# Patient Record
Sex: Female | Born: 1965 | Hispanic: Yes | Marital: Married | Smoking: Never smoker
Health system: Southern US, Community
[De-identification: ages and names within clinical notes are randomized; demographics above are authoritative.]

## PROBLEM LIST (undated history)

## (undated) DIAGNOSIS — I10 Essential (primary) hypertension: Secondary | ICD-10-CM

## (undated) DIAGNOSIS — E119 Type 2 diabetes mellitus without complications: Secondary | ICD-10-CM

## (undated) HISTORY — PX: TUBAL LIGATION: SHX77

## (undated) HISTORY — DX: Essential (primary) hypertension: I10

## (undated) HISTORY — DX: Type 2 diabetes mellitus without complications: E11.9

## (undated) HISTORY — PX: CHOLECYSTECTOMY: SHX55

---

## 2002-02-17 ENCOUNTER — Encounter: Payer: Self-pay | Admitting: General Surgery

## 2002-02-17 ENCOUNTER — Ambulatory Visit (HOSPITAL_COMMUNITY): Admission: RE | Admit: 2002-02-17 | Discharge: 2002-02-17 | Payer: Self-pay | Admitting: General Surgery

## 2012-11-10 ENCOUNTER — Other Ambulatory Visit (HOSPITAL_COMMUNITY): Payer: Self-pay | Admitting: Physician Assistant

## 2012-11-10 DIAGNOSIS — Z139 Encounter for screening, unspecified: Secondary | ICD-10-CM

## 2012-11-23 ENCOUNTER — Ambulatory Visit (HOSPITAL_COMMUNITY)
Admission: RE | Admit: 2012-11-23 | Discharge: 2012-11-23 | Disposition: A | Discharge status: Home / Self Care | Payer: Self-pay | Source: Ambulatory Visit | Attending: Physician Assistant | Admitting: Physician Assistant

## 2012-11-23 DIAGNOSIS — Z139 Encounter for screening, unspecified: Secondary | ICD-10-CM

## 2012-11-26 ENCOUNTER — Other Ambulatory Visit: Payer: Self-pay | Admitting: Physician Assistant

## 2012-11-26 DIAGNOSIS — R928 Other abnormal and inconclusive findings on diagnostic imaging of breast: Secondary | ICD-10-CM

## 2013-01-13 ENCOUNTER — Other Ambulatory Visit (HOSPITAL_COMMUNITY): Payer: Self-pay | Admitting: Nurse Practitioner

## 2013-01-13 ENCOUNTER — Ambulatory Visit (HOSPITAL_COMMUNITY)
Admission: RE | Admit: 2013-01-13 | Discharge: 2013-01-13 | Disposition: A | Discharge status: Home / Self Care | Payer: PRIVATE HEALTH INSURANCE | Source: Ambulatory Visit | Attending: Nurse Practitioner | Admitting: Nurse Practitioner

## 2013-01-13 ENCOUNTER — Ambulatory Visit (HOSPITAL_COMMUNITY)
Admission: RE | Admit: 2013-01-13 | Discharge: 2013-01-13 | Disposition: A | Discharge status: Home / Self Care | Payer: PRIVATE HEALTH INSURANCE | Source: Ambulatory Visit | Attending: Physician Assistant | Admitting: Physician Assistant

## 2013-01-13 ENCOUNTER — Other Ambulatory Visit (HOSPITAL_COMMUNITY): Payer: Self-pay | Admitting: Physician Assistant

## 2013-01-13 DIAGNOSIS — R928 Other abnormal and inconclusive findings on diagnostic imaging of breast: Secondary | ICD-10-CM

## 2013-11-17 ENCOUNTER — Other Ambulatory Visit (HOSPITAL_COMMUNITY): Payer: Self-pay | Admitting: Physician Assistant

## 2013-11-17 DIAGNOSIS — Z1231 Encounter for screening mammogram for malignant neoplasm of breast: Secondary | ICD-10-CM

## 2013-12-06 ENCOUNTER — Ambulatory Visit (HOSPITAL_COMMUNITY): Payer: Self-pay

## 2013-12-13 ENCOUNTER — Ambulatory Visit (HOSPITAL_COMMUNITY)
Admission: RE | Admit: 2013-12-13 | Discharge: 2013-12-13 | Disposition: A | Discharge status: Home / Self Care | Payer: Self-pay | Source: Ambulatory Visit | Attending: Physician Assistant | Admitting: Physician Assistant

## 2013-12-13 DIAGNOSIS — Z1231 Encounter for screening mammogram for malignant neoplasm of breast: Secondary | ICD-10-CM

## 2014-12-09 ENCOUNTER — Other Ambulatory Visit: Payer: Self-pay | Admitting: Physician Assistant

## 2014-12-09 LAB — COMPREHENSIVE METABOLIC PANEL
ALT: 47 U/L — ABNORMAL HIGH (ref 6–29)
AST: 41 U/L — AB (ref 10–35)
Albumin: 3.9 g/dL (ref 3.6–5.1)
Alkaline Phosphatase: 116 U/L — ABNORMAL HIGH (ref 33–115)
BILIRUBIN TOTAL: 0.5 mg/dL (ref 0.2–1.2)
BUN: 7 mg/dL (ref 7–25)
CALCIUM: 9.2 mg/dL (ref 8.6–10.2)
CHLORIDE: 100 mmol/L (ref 98–110)
CO2: 27 mmol/L (ref 20–31)
Creat: 0.45 mg/dL — ABNORMAL LOW (ref 0.50–1.10)
Glucose, Bld: 157 mg/dL — ABNORMAL HIGH (ref 65–99)
POTASSIUM: 4.5 mmol/L (ref 3.5–5.3)
Sodium: 136 mmol/L (ref 135–146)
Total Protein: 7.9 g/dL (ref 6.1–8.1)

## 2014-12-09 LAB — HEMOGLOBIN A1C
HEMOGLOBIN A1C: 10.7 % — AB (ref <5.7)
Mean Plasma Glucose: 260 mg/dL — ABNORMAL HIGH (ref <117)

## 2014-12-09 LAB — LIPID PANEL
CHOL/HDL RATIO: 4.1 ratio (ref <=5.0)
Cholesterol: 163 mg/dL (ref 125–200)
HDL: 40 mg/dL — AB (ref >=46)
LDL Cholesterol: 98 mg/dL (ref <130)
Triglycerides: 125 mg/dL (ref <150)
VLDL: 25 mg/dL (ref <30)

## 2014-12-09 LAB — HEMOGLOBIN: Hemoglobin: 11.7 g/dL — ABNORMAL LOW (ref 12.0–15.0)

## 2014-12-10 LAB — MICROALBUMIN, URINE: Microalb, Ur: 3.3 mg/dL

## 2014-12-12 ENCOUNTER — Ambulatory Visit: Payer: Self-pay | Admitting: Physician Assistant

## 2015-01-10 ENCOUNTER — Other Ambulatory Visit: Payer: Self-pay | Admitting: Physician Assistant

## 2015-01-16 ENCOUNTER — Encounter: Payer: Self-pay | Admitting: Physician Assistant

## 2015-01-16 ENCOUNTER — Other Ambulatory Visit: Payer: Self-pay | Admitting: Physician Assistant

## 2015-01-16 ENCOUNTER — Ambulatory Visit: Payer: Self-pay | Admitting: Physician Assistant

## 2015-01-16 VITALS — BP 128/78 | HR 77 | Temp 98.1°F | Ht 58.5 in | Wt 153.8 lb

## 2015-01-16 DIAGNOSIS — E785 Hyperlipidemia, unspecified: Secondary | ICD-10-CM | POA: Insufficient documentation

## 2015-01-16 DIAGNOSIS — E118 Type 2 diabetes mellitus with unspecified complications: Secondary | ICD-10-CM

## 2015-01-16 DIAGNOSIS — E1165 Type 2 diabetes mellitus with hyperglycemia: Secondary | ICD-10-CM

## 2015-01-16 DIAGNOSIS — D649 Anemia, unspecified: Secondary | ICD-10-CM

## 2015-01-16 DIAGNOSIS — E669 Obesity, unspecified: Secondary | ICD-10-CM | POA: Insufficient documentation

## 2015-01-16 DIAGNOSIS — Z1239 Encounter for other screening for malignant neoplasm of breast: Secondary | ICD-10-CM

## 2015-01-16 DIAGNOSIS — IMO0002 Reserved for concepts with insufficient information to code with codable children: Secondary | ICD-10-CM | POA: Insufficient documentation

## 2015-01-16 LAB — GLUCOSE, POCT (MANUAL RESULT ENTRY): POC Glucose: 235 mg/dl — AB (ref 70–99)

## 2015-01-16 MED ORDER — LISINOPRIL 10 MG PO TABS: 10.0000 mg | ORAL_TABLET | Freq: Every day | ORAL | Status: DC

## 2015-01-16 NOTE — Progress Notes (Signed)
BP 128/78 mmHg  Pulse 77  Temp(Src) 98.1 F (36.7 C)  Ht 4' 10.5" (1.486 m)  Wt 153 lb 12.8 oz (69.763 kg)  BMI 31.59 kg/m2  SpO2 99%   Subjective:    Patient ID: Alice McgregorAlma Perez, female    DOB: 01/05/1966, 49 y.o.   MRN: [id number]  HPI: Alice Perez is a 49 y.o. female presenting on 01/16/2015 for Diabetes   HPI Chief Complaint  Patient presents with  . Diabetes    pt states she feels good. pt got labs drawn about a month ago. pt brought BS log.   Pt is also supposed to be taking lisinopril but she is not taking it and doesn't remember the medication (she was taking it at her last OV in September)  Relevant past medical, surgical, family and social history reviewed and updated as indicated. Interim medical history since our last visit reviewed. Allergies and medications reviewed and updated.    Current outpatient prescriptions:  .  ferrous sulfate 325 (65 FE) MG tablet, Take 325 mg by mouth daily., Disp: , Rfl:  .  glyBURIDE micronized (GLYNASE) 6 MG tablet, Take 6 mg by mouth 2 (two) times daily., Disp: , Rfl:  .  insulin NPH-regular Human (NOVOLIN 70/30) (70-30) 100 UNIT/ML injection, Inject into the skin 2 (two) times daily. 15 units qAM and 14 units qhs, Disp: , Rfl:  .  metFORMIN (GLUCOPHAGE) 1000 MG tablet, Take 1 tablet (1,000 mg total) by mouth 2 (two) times daily with a meal. Tome una tableta por boca dos veces diarias con comida, Disp: 60 tablet, Rfl: 2 .  Omega-3 Fatty Acids (FISH OIL PO), Take by mouth daily., Disp: , Rfl:    Review of Systems  Constitutional: Positive for appetite change. Negative for fever, chills, diaphoresis, fatigue and unexpected weight change.  HENT: Negative for congestion, dental problem, drooling, ear pain, facial swelling, hearing loss, mouth sores, sneezing, sore throat, trouble swallowing and voice change.   Eyes: Negative for pain, discharge, redness, itching and visual disturbance.  Respiratory: Negative for cough, choking,  shortness of breath and wheezing.   Cardiovascular: Negative for chest pain, palpitations and leg swelling.  Gastrointestinal: Negative for vomiting, abdominal pain, diarrhea, constipation and blood in stool.  Endocrine: Negative for cold intolerance, heat intolerance and polydipsia.  Genitourinary: Negative for dysuria, hematuria and decreased urine volume.  Musculoskeletal: Negative for back pain, arthralgias and gait problem.  Skin: Negative for rash.  Allergic/Immunologic: Negative for environmental allergies.  Neurological: Negative for seizures, syncope, light-headedness and headaches.  Hematological: Negative for adenopathy.  Psychiatric/Behavioral: Negative for suicidal ideas, dysphoric mood and agitation. The patient is not nervous/anxious.     Per HPI unless specifically indicated above     Objective:    BP 128/78 mmHg  Pulse 77  Temp(Src) 98.1 F (36.7 C)  Ht 4' 10.5" (1.486 m)  Wt 153 lb 12.8 oz (69.763 kg)  BMI 31.59 kg/m2  SpO2 99%  Wt Readings from Last 3 Encounters:  01/16/15 153 lb 12.8 oz (69.763 kg)    Physical Exam  Constitutional: She is oriented to person, place, and time. She appears well-developed and well-nourished.  HENT:  Head: Normocephalic and atraumatic.  Neck: Neck supple.  Cardiovascular: Normal rate and regular rhythm.   Pulmonary/Chest: Effort normal and breath sounds normal.  Abdominal: Soft. Bowel sounds are normal. She exhibits no mass. There is no tenderness.  Musculoskeletal: She exhibits no edema.  Lymphadenopathy:    She has no cervical adenopathy.  Neurological:  She is alert and oriented to person, place, and time.  Skin: Skin is warm and dry.  Psychiatric: She has a normal mood and affect. Her behavior is normal.  Vitals reviewed. foot exam done  Results for orders placed or performed in visit on 12/09/14  Hemoglobin  Result Value Ref Range   Hemoglobin 11.7 (L) 12.0 - 15.0 g/dL  Comprehensive metabolic panel  Result Value  Ref Range   Sodium 136 135 - 146 mmol/L   Potassium 4.5 3.5 - 5.3 mmol/L   Chloride 100 98 - 110 mmol/L   CO2 27 20 - 31 mmol/L   Glucose, Bld 157 (H) 65 - 99 mg/dL   BUN 7 7 - 25 mg/dL   Creat 1.61 (L) 0.96 - 1.10 mg/dL   Total Bilirubin 0.5 0.2 - 1.2 mg/dL   Alkaline Phosphatase 116 (H) 33 - 115 U/L   AST 41 (H) 10 - 35 U/L   ALT 47 (H) 6 - 29 U/L   Total Protein 7.9 6.1 - 8.1 g/dL   Albumin 3.9 3.6 - 5.1 g/dL   Calcium 9.2 8.6 - 04.5 mg/dL  Lipid panel  Result Value Ref Range   Cholesterol 163 125 - 200 mg/dL   Triglycerides 409 <811 mg/dL   HDL 40 (L) >=91 mg/dL   Total CHOL/HDL Ratio 4.1 <=5.0 Ratio   VLDL 25 <30 mg/dL   LDL Cholesterol 98 <478 mg/dL  Hemoglobin G9F  Result Value Ref Range   Hgb A1c MFr Bld 10.7 (H) <5.7 %   Mean Plasma Glucose 260 (H) <117 mg/dL  Microalbumin, urine  Result Value Ref Range   Microalb, Ur 3.3 Not estab mg/dL      Assessment & Plan:   Encounter Diagnoses  Name Primary?  . Type 2 diabetes mellitus with complication, unspecified long term insulin use status (HCC) Yes  . Uncontrolled type 2 diabetes mellitus with complication, unspecified long term insulin use status (HCC)   . Obesity, unspecified   . Anemia, unspecified anemia type   . Hyperlipemia   . Screening for breast cancer    -Order mammo -Discussed a1c not consistent with bs log -pt will RTO for Nurse visit later this week to check her meter (fbs 235 this am in office) -pt needs to restart her lisinopril -F/u OV 3 mo. rto sooner prn

## 2015-01-18 ENCOUNTER — Ambulatory Visit: Payer: Self-pay | Admitting: Physician Assistant

## 2015-01-18 DIAGNOSIS — E118 Type 2 diabetes mellitus with unspecified complications: Principal | ICD-10-CM

## 2015-01-18 DIAGNOSIS — E1165 Type 2 diabetes mellitus with hyperglycemia: Secondary | ICD-10-CM

## 2015-01-18 NOTE — Progress Notes (Signed)
-  had pt check her BS this morning with her own BS monitor to make sure it works properly, pt showed she knew how to properly do her own BS checks. -patient's BS was 272, pt is fasting -pt commented that she checked her sugar last night and it was in the 300s -discussed with pt the importance of writing down her true BS values in order to help in controlling her diabetes. Pt understood. -I explained to pt what her a1c level was as well as what we expect her BS levels to be with her a1c being that high. -I explained to pt what an a1c was since she stated she was not sure. Pt stated she understood -discussed with pt Shannon's plan of increasing her insulin to 20 units BID and to let us know if her BS levels get less than 100. Pt understood. -i explained to pt the importance of  -a printed out rx for novolin 70/30 was given to pt for her to pick up

## 2015-01-24 ENCOUNTER — Other Ambulatory Visit: Payer: Self-pay | Admitting: Physician Assistant

## 2015-01-24 DIAGNOSIS — Z1231 Encounter for screening mammogram for malignant neoplasm of breast: Secondary | ICD-10-CM

## 2015-02-01 ENCOUNTER — Ambulatory Visit (HOSPITAL_COMMUNITY)
Admission: RE | Admit: 2015-02-01 | Discharge: 2015-02-01 | Disposition: A | Discharge status: Home / Self Care | Payer: Self-pay | Source: Ambulatory Visit | Attending: Physician Assistant | Admitting: Physician Assistant

## 2015-02-01 DIAGNOSIS — Z1231 Encounter for screening mammogram for malignant neoplasm of breast: Secondary | ICD-10-CM

## 2015-03-20 ENCOUNTER — Other Ambulatory Visit: Payer: Self-pay | Admitting: Physician Assistant

## 2015-04-17 ENCOUNTER — Ambulatory Visit: Payer: Self-pay | Admitting: Physician Assistant

## 2015-04-24 ENCOUNTER — Encounter: Payer: Self-pay | Admitting: Physician Assistant

## 2015-04-24 ENCOUNTER — Ambulatory Visit: Payer: Self-pay | Admitting: Physician Assistant

## 2015-04-24 VITALS — BP 124/78 | HR 76 | Temp 97.7°F | Ht 58.5 in | Wt 154.2 lb

## 2015-04-24 DIAGNOSIS — D649 Anemia, unspecified: Secondary | ICD-10-CM

## 2015-04-24 DIAGNOSIS — E1165 Type 2 diabetes mellitus with hyperglycemia: Secondary | ICD-10-CM

## 2015-04-24 DIAGNOSIS — E118 Type 2 diabetes mellitus with unspecified complications: Principal | ICD-10-CM

## 2015-04-24 LAB — HEMOGLOBIN A1C
Hgb A1c MFr Bld: 11 % — ABNORMAL HIGH (ref <5.7)
MEAN PLASMA GLUCOSE: 269 mg/dL — AB (ref <117)

## 2015-04-24 LAB — HEMOGLOBIN: Hemoglobin: 10.8 g/dL — ABNORMAL LOW (ref 12.0–15.0)

## 2015-04-24 NOTE — Progress Notes (Signed)
BP 124/78 mmHg  Pulse 76  Temp(Src) 97.7 F (36.5 C)  Ht 4' 10.5" (1.486 m)  Wt 154 lb 4 oz (69.967 kg)  BMI 31.69 kg/m2  SpO2 99%   Subjective:    Patient ID: Alice McgregorAlma Perez, female    DOB: 28-Feb-1965, 50 y.o.   MRN: [id number]  HPI: Alice Mcgregorlma Perez is a [age] y.o. female presenting on 04/24/2015 for Diabetes   HPI   Pt did not bring her bs log with her.  She says it is good.  This am was 198 before breakfast.  Relevant past medical, surgical, family and social history reviewed and updated as indicated. Interim medical history since our last visit reviewed. Allergies and medications reviewed and updated.  Current outpatient prescriptions:  .  ferrous sulfate 325 (65 FE) MG tablet, Take 325 mg by mouth daily., Disp: , Rfl:  .  glyBURIDE micronized (GLYNASE) 6 MG tablet, 1 po bid for diabetes.  Tome una tableta por boca dos veces diarias con comida, Disp: 60 tablet, Rfl: 2 .  insulin NPH-regular Human (NOVOLIN 70/30 RELION) (70-30) 100 UNIT/ML injection, Inject 15 units subcutaneously every morning with breakfast and 14 units every evening with supper.   inyecte 15 unidades subcutaneo cada maana con el desayuno y 14 unidades cada tarde con la sena (Patient taking differently: Inject 20 Units into the skin 2 (two) times daily with a meal. Inject 15 units subcutaneously every morning with breakfast and 14 units every evening with supper.   inyecte 15 unidades subcutaneo cada maana con el desayuno y 14 unidades cada tarde con la sena), Disp: 1 vial, Rfl: 3 .  lisinopril (PRINIVIL,ZESTRIL) 10 MG tablet, Take 1 tablet (10 mg total) by mouth daily. Tome una tableta por boca diaria, Disp: 30 tablet, Rfl: 5 .  metFORMIN (GLUCOPHAGE) 1000 MG tablet, Take 1 tablet (1,000 mg total) by mouth 2 (two) times daily with a meal. Tome una tableta por boca dos veces diarias con comida, Disp: 60 tablet, Rfl: 2 .  Omega-3 Fatty Acids (FISH OIL PO), Take 1 capsule by mouth daily. , Disp: , Rfl:    Review of  Systems  Constitutional: Negative for fever, chills, diaphoresis, appetite change, fatigue and unexpected weight change.  HENT: Negative for congestion, dental problem, drooling, ear pain, facial swelling, hearing loss, mouth sores, sneezing, sore throat, trouble swallowing and voice change.   Eyes: Negative for pain, discharge, redness, itching and visual disturbance.  Respiratory: Negative for cough, choking, shortness of breath and wheezing.   Cardiovascular: Negative for chest pain, palpitations and leg swelling.  Gastrointestinal: Negative for vomiting, abdominal pain, diarrhea, constipation and blood in stool.  Endocrine: Negative for cold intolerance, heat intolerance and polydipsia.  Genitourinary: Negative for dysuria, hematuria and decreased urine volume.  Musculoskeletal: Negative for back pain, arthralgias and gait problem.  Skin: Negative for rash.  Allergic/Immunologic: Negative for environmental allergies.  Neurological: Negative for seizures, syncope, light-headedness and headaches.  Hematological: Negative for adenopathy.  Psychiatric/Behavioral: Negative for suicidal ideas, dysphoric mood and agitation. The patient is not nervous/anxious.     Per HPI unless specifically indicated above     Objective:    BP 124/78 mmHg  Pulse 76  Temp(Src) 97.7 F (36.5 C)  Ht 4' 10.5" (1.486 m)  Wt 154 lb 4 oz (69.967 kg)  BMI 31.69 kg/m2  SpO2 99%  Wt Readings from Last 3 Encounters:  04/24/15 154 lb 4 oz (69.967 kg)  01/16/15 153 lb 12.8 oz (69.763 kg)  Physical Exam  Constitutional: She is oriented to person, place, and time. She appears well-developed and well-nourished.  Pulmonary/Chest: Effort normal.  Neurological: She is alert and oriented to person, place, and time.  Skin: Skin is warm and dry.  Psychiatric: She has a normal mood and affect. Her behavior is normal.  Vitals reviewed.       Assessment & Plan:   Encounter Diagnoses  Name Primary?  Marland Kitchen  Uncontrolled type 2 diabetes mellitus with complication, unspecified long term insulin use status (HCC) Yes  . Anemia, unspecified anemia type      Get labs drawn F/u 2 wk WITH BS LOG.  Discussed with pt that cannot safely adjust her insulin w/o the bs log. She states understanding

## 2015-05-10 ENCOUNTER — Ambulatory Visit: Payer: Self-pay | Admitting: Physician Assistant

## 2015-05-10 ENCOUNTER — Encounter: Payer: Self-pay | Admitting: Physician Assistant

## 2015-05-10 VITALS — BP 116/72 | HR 80 | Temp 97.7°F | Ht 58.5 in | Wt 152.7 lb

## 2015-05-10 DIAGNOSIS — E1165 Type 2 diabetes mellitus with hyperglycemia: Secondary | ICD-10-CM

## 2015-05-10 DIAGNOSIS — D649 Anemia, unspecified: Secondary | ICD-10-CM

## 2015-05-10 DIAGNOSIS — E118 Type 2 diabetes mellitus with unspecified complications: Principal | ICD-10-CM

## 2015-05-10 MED ORDER — METFORMIN HCL 1000 MG PO TABS: 1000.0000 mg | ORAL_TABLET | Freq: Two times a day (BID) | ORAL | Status: DC

## 2015-05-10 NOTE — Progress Notes (Signed)
BP 116/72 mmHg  Pulse 80  Temp(Src) 97.7 F (36.5 C)  Ht 4' 10.5" (1.486 m)  Wt 152 lb 11.2 oz (69.264 kg)  BMI 31.37 kg/m2  SpO2 98%   Subjective:    Patient ID: Alice McgregorAlma Perez, female    DOB: January 21, 1966, 50 y.o.   MRN: [id number]  HPI: Alice Perez is a 50 y.o. female presenting on 05/10/2015 for Diabetes   HPI  Pt brings bs log- numbers all in the 100's.  Yesterday was 102.  highest was 179.  Discussed this inconsistent with a1c of 11.   bs today 179.- fasting (checked in office)  Relevant past medical, surgical, family and social history reviewed and updated as indicated. Interim medical history since our last visit reviewed. Allergies and medications reviewed and updated.  Current outpatient prescriptions:  .  ferrous sulfate 325 (65 FE) MG tablet, Take 325 mg by mouth daily., Disp: , Rfl:  .  glyBURIDE micronized (GLYNASE) 6 MG tablet, 1 po bid for diabetes.  Tome una tableta por boca dos veces diarias con comida, Disp: 60 tablet, Rfl: 2 .  insulin NPH-regular Human (NOVOLIN 70/30 RELION) (70-30) 100 UNIT/ML injection, Inject 15 units subcutaneously every morning with breakfast and 14 units every evening with supper.   inyecte 15 unidades subcutaneo cada maana con el desayuno y 14 unidades cada tarde con la sena (Patient taking differently: Inject 20 Units into the skin 2 (two) times daily with a meal. Inject 15 units subcutaneously every morning with breakfast and 14 units every evening with supper.   inyecte 15 unidades subcutaneo cada maana con el desayuno y 14 unidades cada tarde con la sena), Disp: 1 vial, Rfl: 3 .  lisinopril (PRINIVIL,ZESTRIL) 10 MG tablet, Take 1 tablet (10 mg total) by mouth daily. Tome una tableta por boca diaria, Disp: 30 tablet, Rfl: 5 .  Omega-3 Fatty Acids (FISH OIL PO), Take 1 capsule by mouth daily. , Disp: , Rfl:  .  metFORMIN (GLUCOPHAGE) 1000 MG tablet, Take 1 tablet (1,000 mg total) by mouth 2 (two) times daily with a meal. Tome una tableta  por boca dos veces diarias con comida (Patient not taking: Reported on 05/10/2015), Disp: 60 tablet, Rfl: 2   Review of Systems  Constitutional: Negative for fever, chills, diaphoresis, appetite change, fatigue and unexpected weight change.  HENT: Negative for congestion, dental problem, drooling, ear pain, facial swelling, hearing loss, mouth sores, sneezing, sore throat, trouble swallowing and voice change.   Eyes: Negative for pain, discharge, redness, itching and visual disturbance.  Respiratory: Negative for cough, choking, shortness of breath and wheezing.   Cardiovascular: Negative for chest pain, palpitations and leg swelling.  Gastrointestinal: Negative for vomiting, abdominal pain, diarrhea, constipation and blood in stool.  Endocrine: Negative for cold intolerance, heat intolerance and polydipsia.  Genitourinary: Negative for dysuria, hematuria and decreased urine volume.  Musculoskeletal: Negative for back pain, arthralgias and gait problem.  Skin: Negative for rash.  Allergic/Immunologic: Negative for environmental allergies.  Neurological: Negative for seizures, syncope, light-headedness and headaches.  Hematological: Negative for adenopathy.  Psychiatric/Behavioral: Negative for suicidal ideas, dysphoric mood and agitation. The patient is not nervous/anxious.     Per HPI unless specifically indicated above     Objective:    BP 116/72 mmHg  Pulse 80  Temp(Src) 97.7 F (36.5 C)  Ht 4' 10.5" (1.486 m)  Wt 152 lb 11.2 oz (69.264 kg)  BMI 31.37 kg/m2  SpO2 98%  Wt Readings from Last 3 Encounters:  05/10/15  152 lb 11.2 oz (69.264 kg)  04/24/15 154 lb 4 oz (69.967 kg)  01/16/15 153 lb 12.8 oz (69.763 kg)    Physical Exam  Constitutional: She is oriented to person, place, and time. She appears well-developed and well-nourished.  HENT:  Head: Normocephalic and atraumatic.  Neck: Neck supple.  Cardiovascular: Normal rate and regular rhythm.   Pulmonary/Chest: Effort  normal and breath sounds normal.  Abdominal: Soft. Bowel sounds are normal. She exhibits no mass. There is no hepatosplenomegaly. There is no tenderness.  Musculoskeletal: She exhibits no edema.  Lymphadenopathy:    She has no cervical adenopathy.  Neurological: She is alert and oriented to person, place, and time.  Skin: Skin is warm and dry.  Psychiatric: She has a normal mood and affect. Her behavior is normal.  Vitals reviewed.   Results for orders placed or performed in visit on 04/24/15  HgB A1c  Result Value Ref Range   Hgb A1c MFr Bld 11.0 (H) <5.7 %   Mean Plasma Glucose 269 (H) <117 mg/dL  Hemoglobin  Result Value Ref Range   Hemoglobin 10.8 (L) 12.0 - 15.0 g/dL      Assessment & Plan:    Encounter Diagnoses  Name Primary?  Marland Kitchen Uncontrolled type 2 diabetes mellitus with complication, unspecified long term insulin use status (HCC) Yes  . Anemia, unspecified anemia type     -Reviewed labs with pt -apptointment with nurse 1 wk to check meter -F/u 1 month with bs log -Continue iron and eat iron rich diet

## 2015-05-10 NOTE — Patient Instructions (Addendum)
Dieta con alto contenido de hierro (Iron-Rich Diet) El hierro es un mineral que ayuda al organismo a producir hemoglobina. La hemoglobina es una protena de los glbulos rojos que transporta el oxgeno a los tejidos del cuerpo. Consumir muy poco hierro Stryker Corporationpuede hacer que se sienta dbil y Hawthornecansado, y aumentar su riesgo de contraer infecciones. Consumir la cantidad suficiente de hierro es necesario para el metabolismo del cuerpo, el funcionamiento muscular y el [place] Cypresssistema nervioso. El hierro es un componente natural de muchos alimentos. Tambin puede agregarse a los alimentos, o bien los alimentos pueden fortificarse con hierro. Hay dos tipos de hierro de los alimentos:  Hierro hemo. El organismo absorbe el hierro hemo con mayor facilidad que el no hemo. La carne de res, de ave y de pescado contienen hierro hemo.  Hierro no hemo. Se encuentra en los complementos alimenticios, los cereales fortificados con hierro, los frijoles y las verduras. Es posible que deba seguir una dieta con alto contenido de hierro en los siguientes casos:  Si le han diagnosticado una deficiencia de hierro o anemia por deficiencia de hierro.  Si tiene una enfermedad que le impide absorber el hierro de los alimentos, por ejemplo:  Infecciones intestinales.  Enfermedad celaca. Esto incluye la inflamacin permanente (crnica) de los intestinos.  No consume suficiente hierro.  Consume una dieta que incluye muchos alimentos que afectan la absorcin de hierro.  Ha perdido The Progressive Corporationmucha sangre.  Tiene sangrado abundante cuando Sebekamenstra.  Est embarazada. EN QU CONSISTE EL PLAN? El mdico puede ayudarlo a Production assistant, radiodeterminar la cantidad de hierro que necesita a diario en funcin de su cuadro clnico. Por lo general, Adelene Amascuando una persona consume cantidades suficientes de hierro en la dieta, se satisfacen las siguientes necesidades de hierro:  Hombres.  De 14 a 18aos: 11mg  al da.  De 19 a 50aos: 8mg  al Futures traderda.  Mujeres.   De 14 a  18aos: 15mg  al da.  De 19 a 50aos: 18mg  al da.  Mayores de 50aos: 8mg  al da.  Embarazadas: 27mg  al da.  Mujeres en perodo de lactancia: 9mg  al da. QU DEBO SABER ACERCA DE LA DIETA CON ALTO CONTENIDO DE HIERRO?  Consuma frutas y verduras frescas con alto contenido de vitaminaC junto con alimentos ricos en hierro. Esto ayudar a aumentar la cantidad de hierro que el cuerpo absorbe de los alimentos, en especial, con los que contienen hierro no hemo. Entre los alimentos con alto contenido de vitaminaC se incluyen las Refugionaranjas, los pimientos morrones, los tomates y Social workerel mango.  Tome los suplementos de hierro solamente como se lo haya indicado el mdico. La sobredosis de hierro puede ser potencialmente mortal. Si le recetan suplementos de hierro, tmelos con jugo de naranja o un suplemento de vitaminaC.  Cocine los alimentos en ollas de hierro.   Consuma alimentos que contengan hierro no hemo junto con aquellos con alto contenido de hierro hemo. Esto ayuda a mejorar la absorcin de hierro.   Determinados alimentos y ciertas bebidas contienen compuestos que afectan la absorcin de hierro. No consuma estos alimentos en la misma comida que aquellos con alto contenido de hierro o con suplementos de Company secretaryhierro. Estos incluyen los siguientes:  Caf, t negro y vino tinto.  Leche, productos lcteos y alimentos con alto contenido de calcio.  Frijoles, porotos de soja y [place] Elmoguisantes.  Cereales integrales.  Cuando consuma alimentos que contengan hierro no hemo y compuestos que afecten la absorcin de hierro, siga estos consejos para mejorar la absorcin de hierro.   Antes de cocinarlos, remoje  los frijoles durante la noche.  Antes de usarlos, remoje los cereales integrales durante la noche y culelos.  Prepare un fermento con las harinas antes del horneado, como si usara levadura en la masa del pan. QU ALIMENTOS PUEDO COMER? Cereales Cereales para el desayuno fortificados con  hierro. Pan de trigo integral fortificado con hierro. Arroz enriquecido. Granos germinados. Verduras [place]. Papas con cscara. Guisantes. Brcoli. Pimientos morrones rojos y verdes. Verduras fermentadas. Frutas Ciruelas pasas. Pasas. Naranjas. Fresas. Mango. Pomelo. Carnes y otras fuentes de protenas Hgado de res. Ostras. Carne de vaca. Camarones. Pavo. Pollo. Atn. Sardinas. Garbanzos. Nueces. Tofu. Bebidas Jugo de tomate. Jugo de naranja recin exprimido. Jugo de ciruelas. T de hibisco. Batidos instantneos fortificados para el desayuno. Condimentos Tahini. Salsa de soja fermentada. Dulces y postres Melaza negra.  Otros Germen de trigo. Los artculos mencionados arriba pueden no ser una lista completa de las bebidas o los alimentos recomendados. Comunquese con el nutricionista para conocer ms opciones. QU ALIMENTOS NO SE RECOMIENDAN? Cereales Cereales integrales. Cereal de salvado. Harina de salvado. Avena. Verduras [place]ofas. Repollitos de Bruselas. Col rizada. Frutas Arndanos. Frambuesas. Fresas. Higos. Carnes y otras fuentes de protenas Soja. Productos elaborados a base de protena de la soja. Lcteos Leche. Crema. Queso. Yogur. Queso cottage. Bebidas Caf. T negro. Vino tinto. Dulces y postres Cacao. Chocolate. Helados. Otros [place]. Organo. Perejil. Los artculos mencionados arriba pueden no ser una lista completa de las bebidas y los alimentos que se deben evitar. Comunquese con el nutricionista para obtener ms informacin.   Esta informacin no tiene como fin reemplazar el consejo del mdico. Asegrese de hacerle al mdico cualquier pregunta que tenga.   Document Released: 11/21/2005 Document Revised: 02/25/2014 Elsevier Interactive Patient Education 2016 [employer].  

## 2015-05-17 ENCOUNTER — Ambulatory Visit: Payer: Self-pay | Admitting: Physician Assistant

## 2015-05-24 ENCOUNTER — Ambulatory Visit: Payer: Self-pay | Admitting: Physician Assistant

## 2015-05-24 DIAGNOSIS — E118 Type 2 diabetes mellitus with unspecified complications: Principal | ICD-10-CM

## 2015-05-24 DIAGNOSIS — E1165 Type 2 diabetes mellitus with hyperglycemia: Secondary | ICD-10-CM

## 2015-05-24 NOTE — Patient Instructions (Signed)
La diabetes mellitus y los alimentos (Diabetes Mellitus and Food) Es importante que controle su nivel de azcar en la sangre (glucosa). El nivel de glucosa en sangre depende en gran medida de lo que usted come. Comer alimentos saludables en las cantidades adecuadas a lo largo del da, aproximadamente a la misma hora todos los das, lo ayudar a controlar su nivel de glucosa en sangre. Tambin puede ayudarlo a retrasar o evitar el empeoramiento de la diabetes mellitus. Comer de manera saludable incluso puede ayudarlo a mejorar el nivel de presin arterial y a alcanzar o mantener un peso saludable.  Entre las recomendaciones generales para alimentarse y cocinar los alimentos de forma saludable, se incluyen las siguientes:  Respetar las comidas principales y comer colaciones con regularidad. Evitar pasar largos perodos sin comer con el fin de perder peso.  Seguir una dieta que consista principalmente en alimentos de origen vegetal, como frutas, vegetales, frutos secos, legumbres y cereales integrales.  Utilizar mtodos de coccin a baja temperatura, como hornear, en lugar de mtodos de coccin a alta temperatura, como frer en abundante aceite. Trabaje con el nutricionista para aprender a usar la informacin nutricional de las etiquetas de los alimentos. CMO PUEDEN AFECTARME LOS ALIMENTOS? Carbohidratos Los carbohidratos afectan el nivel de glucosa en sangre ms que cualquier otro tipo de alimento. El nutricionista lo ayudar a determinar cuntos carbohidratos puede consumir en cada comida y ensearle a contarlos. El recuento de carbohidratos es importante para mantener la glucosa en sangre en un nivel saludable, en especial si utiliza insulina o toma determinados medicamentos para la diabetes mellitus. Alcohol El alcohol puede provocar disminuciones sbitas de la glucosa en sangre (hipoglucemia), en especial si utiliza insulina o toma determinados medicamentos para la diabetes mellitus. La  hipoglucemia es una afeccin que puede poner en peligro la vida. Los sntomas de la hipoglucemia (somnolencia, mareos y desorientacin) son similares a los sntomas de haber consumido mucho alcohol.  Si el mdico lo autoriza a beber alcohol, hgalo con moderacin y siga estas pautas:  Las mujeres no deben beber ms de un trago por da, y los hombres no deben beber ms de dos tragos por da. Un trago es igual a:  12 onzas (355 ml) de cerveza  5 onzas de vino (150 ml) de vino  1,5onzas (45ml) de bebidas espirituosas  No beba con el estmago vaco.  Mantngase hidratado. Beba agua, gaseosas dietticas o t helado sin azcar.  Las gaseosas comunes, los jugos y otros refrescos podran contener muchos carbohidratos y se deben contar. QU ALIMENTOS NO SE RECOMIENDAN? Cuando haga las elecciones de alimentos, es importante que recuerde que todos los alimentos son distintos. Algunos tienen menos nutrientes que otros por porcin, aunque podran tener la misma cantidad de caloras o carbohidratos. Es difcil darle al cuerpo lo que necesita cuando consume alimentos con menos nutrientes. Estos son algunos ejemplos de alimentos que debera evitar ya que contienen muchas caloras y carbohidratos, pero pocos nutrientes:  Grasas trans (la mayora de los alimentos procesados incluyen grasas trans en la etiqueta de Informacin nutricional).  Gaseosas comunes.  Jugos.  Caramelos.  Dulces, como tortas, pasteles, rosquillas y galletas.  Comidas fritas. QU ALIMENTOS PUEDO COMER? Consuma alimentos ricos en nutrientes, que nutrirn el cuerpo y lo mantendrn saludable. Los alimentos que debe comer tambin dependern de varios factores, como:  Las caloras que necesita.  Los medicamentos que toma.  Su peso.  El nivel de glucosa en sangre.  El nivel de presin arterial.  El nivel de colesterol.   Debe consumir una amplia variedad de alimentos, por ejemplo:  Protenas.  Cortes de carne  magros.  Protenas con bajo contenido de grasas saturadas, como pescado, clara de huevo y frijoles. Evite las carnes procesadas.  Frutas y vegetales.  Frutas y vegetales que pueden ayudar a controlar los niveles sanguneos de glucosa, como manzanas, mangos y batatas.  Productos lcteos.  Elija productos lcteos sin grasa o con bajo contenido de grasa, como leche, yogur y queso.  Cereales, panes, pastas y arroz.  Elija cereales integrales, como panes multicereales, avena en grano y arroz integral. Estos alimentos pueden ayudar a controlar la presin arterial.  Grasas.  Alimentos que contengan grasas saludables, como frutos secos, aguacate, aceite de oliva, aceite de canola y pescado. TODOS LOS QUE PADECEN DIABETES MELLITUS TIENEN EL MISMO PLAN DE COMIDAS? Dado que todas las personas que padecen diabetes mellitus son distintas, no hay un solo plan de comidas que funcione para todos. Es muy importante que se rena con un nutricionista que lo ayudar a crear un plan de comidas adecuado para usted.   Esta informacin no tiene como fin reemplazar el consejo del mdico. Asegrese de hacerle al mdico cualquier pregunta que tenga.   Document Released: 05/14/2007 Document Revised: 02/25/2014 Elsevier Interactive Patient Education 2016 [employer]  

## 2015-05-24 NOTE — Progress Notes (Signed)
Pt brought in her glucose meter and test strips. Her BS was checked using pt's own BS monitor and the office's BS monitor for comparison. Pt is not fasting  First check on L index finger using: -Pt monitor and test strips (expired) BS level: 271 NFBS -[facility]CRC monitor and test strips: 281 NFBS  Upon further inspection of pt's BS monitor and glucose tests strips, it was discovered that test strips had expired 2015. Pt stated she would buy test strips next week due to [facility]. Pt was given a new box of unexpired test strips (without charge) and was advised to quit using the expired ones. Pt demonstrated understanding.  Pt's BS levels were checked again.  Second check on L middle finger using: -Pt monitor and FCRC test strips: 308 NFBS -[facility]CRC monitor and FCRC test strips: 278 NFBS  Pt was educated on a diabetic diet and was given reading material in spanish about diabetic diet.  Pt was informed of a1c level of 11, and was reminded to check her fasting bs in the mornings, or when she was not feeling well, and to make sure she took all medications as prescribed by PA. Pt was also reminded to bring BS log to her f/u appt  Pt denied having any further questions.

## 2015-06-07 ENCOUNTER — Ambulatory Visit: Payer: Self-pay | Admitting: Physician Assistant

## 2015-06-07 ENCOUNTER — Encounter: Payer: Self-pay | Admitting: Physician Assistant

## 2015-06-07 VITALS — BP 100/64 | HR 81 | Temp 97.7°F | Ht 58.5 in | Wt 154.6 lb

## 2015-06-07 DIAGNOSIS — E1165 Type 2 diabetes mellitus with hyperglycemia: Secondary | ICD-10-CM

## 2015-06-07 DIAGNOSIS — E118 Type 2 diabetes mellitus with unspecified complications: Principal | ICD-10-CM

## 2015-06-07 DIAGNOSIS — D649 Anemia, unspecified: Secondary | ICD-10-CM

## 2015-06-07 NOTE — Progress Notes (Signed)
BP 100/64 mmHg  Pulse 81  Temp(Src) 97.7 F (36.5 C)  Ht 4' 10.5" (1.486 m)  Wt 154 lb 9.6 oz (70.126 kg)  BMI 31.76 kg/m2  SpO2 99%   Subjective:    Patient ID: Alice Perez, female    DOB: 01/21/1966, 50 y.o.   MRN: [id number]  HPI: Alice Perez is a 50 y.o. female presenting on 06/07/2015 for Diabetes   HPI   Chief Complaint  Patient presents with  . Diabetes     reviewed bs log- 90-216, most 125-150 .  Pt did not bring her meter.  Pt says that these are the numbers she gets out of her meter, not made up numbers  Relevant past medical, surgical, family and social history reviewed and updated as indicated. Interim medical history since our last visit reviewed. Allergies and medications reviewed and updated.   Current outpatient prescriptions:  .  ferrous sulfate 325 (65 FE) MG tablet, Take 325 mg by mouth daily., Disp: , Rfl:  .  glyBURIDE micronized (GLYNASE) 6 MG tablet, 1 po bid for diabetes.  Tome una tableta por boca dos veces diarias con comida, Disp: 60 tablet, Rfl: 2 .  insulin NPH-regular Human (NOVOLIN 70/30 RELION) (70-30) 100 UNIT/ML injection, Inject 15 units subcutaneously every morning with breakfast and 14 units every evening with supper.   inyecte 15 unidades subcutaneo cada maana con el desayuno y 14 unidades cada tarde con la sena (Patient taking differently: Inject 20 Units into the skin 2 (two) times daily with a meal. Inject 15 units subcutaneously every morning with breakfast and 14 units every evening with supper.   inyecte 15 unidades subcutaneo cada maana con el desayuno y 14 unidades cada tarde con la sena), Disp: 1 vial, Rfl: 3 .  lisinopril (PRINIVIL,ZESTRIL) 10 MG tablet, Take 1 tablet (10 mg total) by mouth daily. Tome una tableta por boca diaria, Disp: 30 tablet, Rfl: 5 .  metFORMIN (GLUCOPHAGE) 1000 MG tablet, Take 1 tablet (1,000 mg total) by mouth 2 (two) times daily with a meal. Tome una tableta por boca dos veces diarias con comida, Disp:  60 tablet, Rfl: 4 .  Omega-3 Fatty Acids (FISH OIL PO), Take 1,200 mg by mouth daily. , Disp: , Rfl:    Review of Systems  Constitutional: Negative for fever, chills, diaphoresis, appetite change, fatigue and unexpected weight change.  HENT: Negative for congestion, dental problem, drooling, ear pain, facial swelling, hearing loss, mouth sores, sneezing, sore throat, trouble swallowing and voice change.   Eyes: Negative for pain, discharge, redness, itching and visual disturbance.  Respiratory: Negative for cough, choking, shortness of breath and wheezing.   Cardiovascular: Negative for chest pain, palpitations and leg swelling.  Gastrointestinal: Negative for vomiting, abdominal pain, diarrhea, constipation and blood in stool.  Endocrine: Negative for cold intolerance, heat intolerance and polydipsia.  Genitourinary: Negative for dysuria, hematuria and decreased urine volume.  Musculoskeletal: Negative for back pain, arthralgias and gait problem.  Skin: Negative for rash.  Allergic/Immunologic: Negative for environmental allergies.  Neurological: Negative for seizures, syncope, light-headedness and headaches.  Hematological: Negative for adenopathy.  Psychiatric/Behavioral: Negative for suicidal ideas, dysphoric mood and agitation. The patient is not nervous/anxious.     Per HPI unless specifically indicated above     Objective:    BP 100/64 mmHg  Pulse 81  Temp(Src) 97.7 F (36.5 C)  Ht 4' 10.5" (1.486 m)  Wt 154 lb 9.6 oz (70.126 kg)  BMI 31.76 kg/m2  SpO2 99%  Wt  Readings from Last 3 Encounters:  06/07/15 154 lb 9.6 oz (70.126 kg)  05/10/15 152 lb 11.2 oz (69.264 kg)  04/24/15 154 lb 4 oz (69.967 kg)    Physical Exam  Constitutional: She is oriented to person, place, and time. She appears well-developed and well-nourished.  HENT:  Head: Normocephalic and atraumatic.  Neck: Neck supple.  Cardiovascular: Normal rate and regular rhythm.   Pulmonary/Chest: Effort normal  and breath sounds normal.  Abdominal: Soft. Bowel sounds are normal. She exhibits no mass. There is no hepatosplenomegaly. There is no tenderness.  Musculoskeletal: She exhibits no edema.  Lymphadenopathy:    She has no cervical adenopathy.  Neurological: She is alert and oriented to person, place, and time.  Skin: Skin is warm and dry.  Psychiatric: She has a normal mood and affect. Her behavior is normal.  Vitals reviewed.       Assessment & Plan:   Encounter Diagnoses  Name Primary?  Marland Kitchen. Uncontrolled type 2 diabetes mellitus with complication, unspecified long term insulin use status (HCC) Yes  . Anemia, unspecified anemia type      -again emphasized to pt the importance of writing down the correct bs values, even if they are high, because correct values are important for adjusting her medication -F/u 2 months with bs log and meter.  Will check a1c before appt

## 2015-08-01 ENCOUNTER — Other Ambulatory Visit: Payer: Self-pay | Admitting: Student

## 2015-08-01 DIAGNOSIS — D649 Anemia, unspecified: Secondary | ICD-10-CM

## 2015-08-01 DIAGNOSIS — E1165 Type 2 diabetes mellitus with hyperglycemia: Secondary | ICD-10-CM

## 2015-08-01 DIAGNOSIS — E118 Type 2 diabetes mellitus with unspecified complications: Principal | ICD-10-CM

## 2015-08-04 LAB — HEMOGLOBIN: HEMOGLOBIN: 12 g/dL (ref 11.7–15.5)

## 2015-08-05 LAB — HEMOGLOBIN A1C
Hgb A1c MFr Bld: 8.9 % — ABNORMAL HIGH (ref <5.7)
MEAN PLASMA GLUCOSE: 209 mg/dL

## 2015-08-07 ENCOUNTER — Ambulatory Visit: Payer: Self-pay | Admitting: Physician Assistant

## 2015-08-07 ENCOUNTER — Encounter: Payer: Self-pay | Admitting: Physician Assistant

## 2015-08-07 VITALS — BP 120/76 | HR 71 | Temp 97.9°F | Ht 58.5 in | Wt 153.8 lb

## 2015-08-07 DIAGNOSIS — E118 Type 2 diabetes mellitus with unspecified complications: Principal | ICD-10-CM

## 2015-08-07 DIAGNOSIS — D649 Anemia, unspecified: Secondary | ICD-10-CM

## 2015-08-07 DIAGNOSIS — E1165 Type 2 diabetes mellitus with hyperglycemia: Secondary | ICD-10-CM

## 2015-08-07 DIAGNOSIS — E669 Obesity, unspecified: Secondary | ICD-10-CM

## 2015-08-07 DIAGNOSIS — I10 Essential (primary) hypertension: Secondary | ICD-10-CM

## 2015-08-07 DIAGNOSIS — E785 Hyperlipidemia, unspecified: Secondary | ICD-10-CM

## 2015-08-07 MED ORDER — GLYBURIDE MICRONIZED 6 MG PO TABS: ORAL_TABLET | ORAL | Status: DC

## 2015-08-07 NOTE — Progress Notes (Signed)
BP 120/76 mmHg  Pulse 71  Temp(Src) 97.9 F (36.6 C)  Ht 4' 10.5" (1.486 m)  Wt 153 lb 12 oz (69.741 kg)  BMI 31.58 kg/m2  SpO2 99%   Subjective:    Patient ID: Alice Perez, female    DOB: [birth year], 50 y.o.   MRN: [id number]  HPI: Alice Perez is a 50 y.o. female presenting on 08/07/2015 for Diabetes   HPI Pt says she is doing well today. She states no episodes low bs.  Relevant past medical, surgical, family and social history reviewed and updated as indicated. Interim medical history since our last visit reviewed. Allergies and medications reviewed and updated.   Current outpatient prescriptions:  .  ferrous sulfate 325 (65 FE) MG tablet, Take 325 mg by mouth daily., Disp: , Rfl:  .  glyBURIDE micronized (GLYNASE) 6 MG tablet, 1 po bid for diabetes.  Tome una tableta por boca dos veces diarias con comida, Disp: 60 tablet, Rfl: 2 .  insulin NPH-regular Human (NOVOLIN 70/30 RELION) (70-30) 100 UNIT/ML injection, Inject 15 units subcutaneously every morning with breakfast and 14 units every evening with supper.   inyecte 15 unidades subcutaneo cada maana con el desayuno y 14 unidades cada tarde con la sena (Patient taking differently: Inject 20 Units into the skin 2 (two) times daily with a meal. Inject 15 units subcutaneously every morning with breakfast and 14 units every evening with supper.   inyecte 15 unidades subcutaneo cada maana con el desayuno y 14 unidades cada tarde con la sena), Disp: 1 vial, Rfl: 3 .  lisinopril (PRINIVIL,ZESTRIL) 10 MG tablet, Take 1 tablet (10 mg total) by mouth daily. Tome una tableta por boca diaria, Disp: 30 tablet, Rfl: 5 .  metFORMIN (GLUCOPHAGE) 1000 MG tablet, Take 1 tablet (1,000 mg total) by mouth 2 (two) times daily with a meal. Tome una tableta por boca dos veces diarias con comida, Disp: 60 tablet, Rfl: 4 .  Omega-3 Fatty Acids (FISH OIL PO), Take 1,200 mg by mouth daily. , Disp: , Rfl:    Review of Systems  Constitutional: Negative  for fever, chills, diaphoresis, appetite change, fatigue and unexpected weight change.  HENT: Negative for congestion, dental problem, drooling, ear pain, facial swelling, hearing loss, mouth sores, sneezing, sore throat, trouble swallowing and voice change.   Eyes: Negative for pain, discharge, redness, itching and visual disturbance.  Respiratory: Negative for cough, choking, shortness of breath and wheezing.   Cardiovascular: Negative for chest pain, palpitations and leg swelling.  Gastrointestinal: Negative for vomiting, abdominal pain, diarrhea, constipation and blood in stool.  Endocrine: Negative for cold intolerance, heat intolerance and polydipsia.  Genitourinary: Negative for dysuria, hematuria and decreased urine volume.  Musculoskeletal: Negative for back pain, arthralgias and gait problem.  Skin: Negative for rash.  Allergic/Immunologic: Negative for environmental allergies.  Neurological: Negative for seizures, syncope, light-headedness and headaches.  Hematological: Negative for adenopathy.  Psychiatric/Behavioral: Negative for suicidal ideas, dysphoric mood and agitation. The patient is not nervous/anxious.     Per HPI unless specifically indicated above     Objective:    BP 120/76 mmHg  Pulse 71  Temp(Src) 97.9 F (36.6 C)  Ht 4' 10.5" (1.486 m)  Wt 153 lb 12 oz (69.741 kg)  BMI 31.58 kg/m2  SpO2 99%  Wt Readings from Last 3 Encounters:  08/07/15 153 lb 12 oz (69.741 kg)  06/07/15 154 lb 9.6 oz (70.126 kg)  05/10/15 152 lb 11.2 oz (69.264 kg)    Physical Exam  Constitutional: She is oriented to person, place, and time. She appears well-developed and well-nourished.  HENT:  Head: Normocephalic and atraumatic.  Neck: Neck supple.  Cardiovascular: Normal rate and regular rhythm.   Pulmonary/Chest: Effort normal and breath sounds normal.  Abdominal: Soft. Bowel sounds are normal. She exhibits no mass. There is no hepatosplenomegaly. There is no tenderness.   Musculoskeletal: She exhibits no edema.  Lymphadenopathy:    She has no cervical adenopathy.  Neurological: She is alert and oriented to person, place, and time.  Skin: Skin is warm and dry.  Psychiatric: She has a normal mood and affect. Her behavior is normal.  Vitals reviewed.     Results for orders placed or performed in visit on 08/01/15  HgB A1c  Result Value Ref Range   Hgb A1c MFr Bld 8.9 (H) <5.7 %   Mean Plasma Glucose 209 mg/dL  Hemoglobin  Result Value Ref Range   Hemoglobin 12.0 11.7 - 15.5 g/dL      Assessment & Plan:   Encounter Diagnoses  Name Primary?  Marland Kitchen. Uncontrolled type 2 diabetes mellitus with complication, unspecified long term insulin use status (HCC) Yes  . Anemia, unspecified anemia type   . Essential hypertension, benign   . Hyperlipidemia   . Obesity, unspecified      -Reviewed labs with pt -refer pt for annual DM Eye exam-  -Increase insulin to 21u bid -counseled pt on regular exercise for improving diabetes control and improving overall health -continue current medications -F/u 3 months.  RTO sooner prn

## 2015-10-30 ENCOUNTER — Other Ambulatory Visit: Payer: Self-pay | Admitting: Student

## 2015-10-30 DIAGNOSIS — D649 Anemia, unspecified: Secondary | ICD-10-CM

## 2015-10-30 DIAGNOSIS — E118 Type 2 diabetes mellitus with unspecified complications: Principal | ICD-10-CM

## 2015-10-30 DIAGNOSIS — I10 Essential (primary) hypertension: Secondary | ICD-10-CM

## 2015-10-30 DIAGNOSIS — E785 Hyperlipidemia, unspecified: Secondary | ICD-10-CM

## 2015-10-30 DIAGNOSIS — E1165 Type 2 diabetes mellitus with hyperglycemia: Secondary | ICD-10-CM

## 2015-11-03 ENCOUNTER — Other Ambulatory Visit: Payer: Self-pay | Admitting: Physician Assistant

## 2015-11-04 LAB — COMPLETE METABOLIC PANEL WITH GFR
ALT: 36 U/L — ABNORMAL HIGH (ref 6–29)
AST: 36 U/L — ABNORMAL HIGH (ref 10–35)
Albumin: 3.8 g/dL (ref 3.6–5.1)
Alkaline Phosphatase: 114 U/L (ref 33–115)
BUN: 8 mg/dL (ref 7–25)
CALCIUM: 9.1 mg/dL (ref 8.6–10.2)
CHLORIDE: 102 mmol/L (ref 98–110)
CO2: 23 mmol/L (ref 20–31)
Creat: 0.6 mg/dL (ref 0.50–1.10)
GFR, Est Non African American: >89 mL/min (ref >=60)
Glucose, Bld: 174 mg/dL — ABNORMAL HIGH (ref 65–99)
POTASSIUM: 4.3 mmol/L (ref 3.5–5.3)
Sodium: 136 mmol/L (ref 135–146)
Total Bilirubin: 0.3 mg/dL (ref 0.2–1.2)
Total Protein: 7.3 g/dL (ref 6.1–8.1)

## 2015-11-04 LAB — LIPID PANEL
CHOL/HDL RATIO: 4.5 ratio (ref <=5.0)
CHOLESTEROL: 172 mg/dL (ref 125–200)
HDL: 38 mg/dL — AB (ref >=46)
LDL Cholesterol: 99 mg/dL (ref <130)
TRIGLYCERIDES: 175 mg/dL — AB (ref <150)
VLDL: 35 mg/dL — AB (ref <30)

## 2015-11-04 LAB — HEMOGLOBIN: Hemoglobin: 12.7 g/dL (ref 11.7–15.5)

## 2015-11-04 LAB — MICROALBUMIN, URINE: Microalb, Ur: 86.9 mg/dL

## 2015-11-04 LAB — HEMATOCRIT: HCT: 37.9 % (ref 35.0–45.0)

## 2015-11-05 LAB — HEMOGLOBIN A1C
Hgb A1c MFr Bld: 9.5 % — ABNORMAL HIGH (ref <5.7)
Mean Plasma Glucose: 226 mg/dL

## 2015-11-06 ENCOUNTER — Ambulatory Visit: Payer: Self-pay | Admitting: Physician Assistant

## 2015-11-07 ENCOUNTER — Ambulatory Visit: Payer: Self-pay | Admitting: Physician Assistant

## 2015-11-07 ENCOUNTER — Encounter: Payer: Self-pay | Admitting: Physician Assistant

## 2015-11-07 VITALS — BP 118/80 | HR 73 | Temp 97.7°F | Ht 58.5 in | Wt 153.0 lb

## 2015-11-07 DIAGNOSIS — E1165 Type 2 diabetes mellitus with hyperglycemia: Secondary | ICD-10-CM

## 2015-11-07 DIAGNOSIS — E785 Hyperlipidemia, unspecified: Secondary | ICD-10-CM

## 2015-11-07 DIAGNOSIS — D649 Anemia, unspecified: Secondary | ICD-10-CM

## 2015-11-07 DIAGNOSIS — E118 Type 2 diabetes mellitus with unspecified complications: Principal | ICD-10-CM

## 2015-11-07 DIAGNOSIS — E669 Obesity, unspecified: Secondary | ICD-10-CM

## 2015-11-07 NOTE — Progress Notes (Signed)
BP 118/80 (BP Location: Left Arm, Patient Position: Sitting, Cuff Size: Normal)   Pulse 73   Temp 97.7 F (36.5 C)   Ht 4' 10.5" (1.486 m)   Wt 153 lb (69.4 kg)   SpO2 99%   BMI 31.43 kg/m    Subjective:    Patient ID: Alice Perez, female    DOB: 1965/04/24, 50 y.o.   MRN: [id number]  HPI: Alice Mcgregorlma Perez is a 50 y.o. female presenting on 11/07/2015 for Diabetes   HPI   Pt without complaint today  Relevant past medical, surgical, family and social history reviewed and updated as indicated. Interim medical history since our last visit reviewed. Allergies and medications reviewed and updated.   Current Outpatient Prescriptions:  .  ferrous sulfate 325 (65 FE) MG tablet, Take 325 mg by mouth daily., Disp: , Rfl:  .  glyBURIDE micronized (GLYNASE) 6 MG tablet, 1 po bid for diabetes.  Tome una tableta por boca dos veces diarias con comida, Disp: 60 tablet, Rfl: 4 .  insulin NPH-regular Human (NOVOLIN 70/30 RELION) (70-30) 100 UNIT/ML injection, Inject 15 units subcutaneously every morning with breakfast and 14 units every evening with supper.   inyecte 15 unidades subcutaneo cada maana con el desayuno y 14 unidades cada tarde con la sena, Disp: 1 vial, Rfl: 3 .  lisinopril (PRINIVIL,ZESTRIL) 10 MG tablet, Take 1 tablet (10 mg total) by mouth daily. Tome una tableta por boca diaria, Disp: 30 tablet, Rfl: 5 .  Omega-3 Fatty Acids (FISH OIL PO), Take 1,200 mg by mouth daily. , Disp: , Rfl:  .  metFORMIN (GLUCOPHAGE) 1000 MG tablet, Take 1 tablet (1,000 mg total) by mouth 2 (two) times daily with a meal. Tome una tableta por boca dos veces diarias con comida (Patient not taking: Reported on 11/07/2015), Disp: 60 tablet, Rfl: 4   Review of Systems  Constitutional: Negative for appetite change, chills, diaphoresis, fatigue, fever and unexpected weight change.  HENT: Negative for congestion, dental problem, drooling, ear pain, facial swelling, hearing loss, mouth sores, sneezing, sore throat,  trouble swallowing and voice change.   Eyes: Negative for pain, discharge, redness, itching and visual disturbance.  Respiratory: Negative for cough, choking, shortness of breath and wheezing.   Cardiovascular: Negative for chest pain, palpitations and leg swelling.  Gastrointestinal: Negative for abdominal pain, blood in stool, constipation, diarrhea and vomiting.  Endocrine: Negative for cold intolerance, heat intolerance and polydipsia.  Genitourinary: Negative for decreased urine volume, dysuria and hematuria.  Musculoskeletal: Negative for arthralgias, back pain and gait problem.  Skin: Negative for rash.  Allergic/Immunologic: Negative for environmental allergies.  Neurological: Negative for seizures, syncope, light-headedness and headaches.  Hematological: Negative for adenopathy.  Psychiatric/Behavioral: Negative for agitation, dysphoric mood and suicidal ideas. The patient is not nervous/anxious.     Per HPI unless specifically indicated above     Objective:    BP 118/80 (BP Location: Left Arm, Patient Position: Sitting, Cuff Size: Normal)   Pulse 73   Temp 97.7 F (36.5 C)   Ht 4' 10.5" (1.486 m)   Wt 153 lb (69.4 kg)   SpO2 99%   BMI 31.43 kg/m   Wt Readings from Last 3 Encounters:  11/07/15 153 lb (69.4 kg)  08/07/15 153 lb 12 oz (69.7 kg)  06/07/15 154 lb 9.6 oz (70.1 kg)    Physical Exam  Constitutional: She is oriented to person, place, and time. She appears well-developed and well-nourished.  HENT:  Head: Normocephalic and atraumatic.  Neck: Neck  supple.  Cardiovascular: Normal rate and regular rhythm.   Pulmonary/Chest: Effort normal and breath sounds normal.  Abdominal: Soft. Bowel sounds are normal. She exhibits no mass. There is no hepatosplenomegaly. There is no tenderness.  Musculoskeletal: She exhibits no edema.  Lymphadenopathy:    She has no cervical adenopathy.  Neurological: She is alert and oriented to person, place, and time.  Skin: Skin is  warm and dry.  Psychiatric: She has a normal mood and affect. Her behavior is normal.  Vitals reviewed.   Results for orders placed or performed in visit on 10/30/15  Microalbumin, urine  Result Value Ref Range   Microalb, Ur 86.9 Not estab mg/dL  Hemoglobin  Result Value Ref Range   Hemoglobin 12.7 11.7 - 15.5 g/dL  Hematocrit  Result Value Ref Range   HCT 37.9 35.0 - 45.0 %  Lipid Profile  Result Value Ref Range   Cholesterol 172 125 - 200 mg/dL   Triglycerides 811 (H) <150 mg/dL   HDL 38 (L) >=91 mg/dL   Total CHOL/HDL Ratio 4.5 <=5.0 Ratio   VLDL 35 (H) <30 mg/dL   LDL Cholesterol 99 <478 mg/dL  COMPLETE METABOLIC PANEL WITH GFR  Result Value Ref Range   Sodium 136 135 - 146 mmol/L   Potassium 4.3 3.5 - 5.3 mmol/L   Chloride 102 98 - 110 mmol/L   CO2 23 20 - 31 mmol/L   Glucose, Bld 174 (H) 65 - 99 mg/dL   BUN 8 7 - 25 mg/dL   Creat 2.95 6.21 - 3.08 mg/dL   Total Bilirubin 0.3 0.2 - 1.2 mg/dL   Alkaline Phosphatase 114 33 - 115 U/L   AST 36 (H) 10 - 35 U/L   ALT 36 (H) 6 - 29 U/L   Total Protein 7.3 6.1 - 8.1 g/dL   Albumin 3.8 3.6 - 5.1 g/dL   Calcium 9.1 8.6 - 65.7 mg/dL   GFR, Est African American >89 >=60 mL/min   GFR, Est Non African American >89 >=60 mL/min  HgB A1c  Result Value Ref Range   Hgb A1c MFr Bld 9.5 (H) <5.7 %   Mean Plasma Glucose 226 mg/dL      Assessment & Plan:   Encounter Diagnoses  Name Primary?  Marland Kitchen Uncontrolled type 2 diabetes mellitus with complication, unspecified long term insulin use status (HCC) Yes  . Anemia, unspecified anemia type   . Hyperlipidemia   . Obesity, unspecified     -Reviewed labs with pt -D/c glyburide -increase insulin to 25u bid.  Pt to call office for fbs < 70 or > 300 -Get back on metformin -F/u 1 month with bs log

## 2015-11-07 NOTE — Patient Instructions (Addendum)
STOP the glyburide  Increase insulin to 25 units twice daily before meals  Call office for fasting blood sugar less than 70 or over 300   Insulina a 25 unidades dos veces al dia antes de comer  Llame ala oficina si su azucar baja menos de 70 o mas de 300

## 2015-12-07 ENCOUNTER — Ambulatory Visit: Payer: Self-pay | Admitting: Physician Assistant

## 2015-12-07 ENCOUNTER — Encounter: Payer: Self-pay | Admitting: Physician Assistant

## 2015-12-07 VITALS — BP 112/68 | HR 77 | Temp 97.7°F | Ht 58.5 in | Wt 152.0 lb

## 2015-12-07 DIAGNOSIS — D649 Anemia, unspecified: Secondary | ICD-10-CM

## 2015-12-07 DIAGNOSIS — E118 Type 2 diabetes mellitus with unspecified complications: Principal | ICD-10-CM

## 2015-12-07 DIAGNOSIS — E1165 Type 2 diabetes mellitus with hyperglycemia: Secondary | ICD-10-CM

## 2015-12-07 NOTE — Progress Notes (Signed)
BP 112/68 (BP Location: Left Arm, Patient Position: Sitting, Cuff Size: Large)   Pulse 77   Temp 97.7 F (36.5 C) (Other (Comment))   Ht 4' 10.5" (1.486 m)   Wt 152 lb (68.9 kg)   LMP 11/07/2015 (Approximate)   SpO2 98%   BMI 31.23 kg/m    Subjective:    Patient ID: Alice Perez, female    DOB: 1965-09-27, 50 y.o.   MRN: [id number]  HPI: Alice Perez is a 50 y.o. female presenting on 12/07/2015 for Diabetes   HPI  Pt is feeling well today  bs log reviewed- am 105-204, pm 90-196  Relevant past medical, surgical, family and social history reviewed and updated as indicated. Interim medical history since our last visit reviewed. Allergies and medications reviewed and updated.   Current Outpatient Prescriptions:  .  ferrous sulfate 325 (65 FE) MG tablet, Take 325 mg by mouth daily., Disp: , Rfl:  .  insulin NPH-regular Human (NOVOLIN 70/30 RELION) (70-30) 100 UNIT/ML injection, Inject 15 units subcutaneously every morning with breakfast and 14 units every evening with supper.   inyecte 15 unidades subcutaneo cada maana con el desayuno y 14 unidades cada tarde con la sena (Patient taking differently: Inject 25 Units into the skin 2 (two) times daily. Inject 15 units subcutaneously every morning with breakfast and 14 units every evening with supper.   inyecte 15 unidades subcutaneo cada maana con el desayuno y 14 unidades cada tarde con la sena), Disp: 1 vial, Rfl: 3 .  lisinopril (PRINIVIL,ZESTRIL) 10 MG tablet, Take 1 tablet (10 mg total) by mouth daily. Tome una tableta por boca diaria, Disp: 30 tablet, Rfl: 5 .  metFORMIN (GLUCOPHAGE) 1000 MG tablet, Take 1 tablet (1,000 mg total) by mouth 2 (two) times daily with a meal. Tome una tableta por boca dos veces diarias con comida, Disp: 60 tablet, Rfl: 4 .  Omega-3 Fatty Acids (FISH OIL PO), Take 1,200 mg by mouth daily. , Disp: , Rfl:    Review of Systems  Constitutional: Negative for appetite change, chills, diaphoresis, fatigue,  fever and unexpected weight change.  HENT: Negative for congestion, dental problem, drooling, ear pain, facial swelling, hearing loss, mouth sores, sneezing, sore throat, trouble swallowing and voice change.   Eyes: Negative for pain, discharge, redness, itching and visual disturbance.  Respiratory: Negative for cough, choking, shortness of breath and wheezing.   Cardiovascular: Negative for chest pain, palpitations and leg swelling.  Gastrointestinal: Negative for abdominal pain, blood in stool, constipation, diarrhea and vomiting.  Endocrine: Negative for cold intolerance, heat intolerance and polydipsia.  Genitourinary: Negative for decreased urine volume, dysuria and hematuria.  Musculoskeletal: Negative for arthralgias, back pain and gait problem.  Skin: Negative for rash.  Allergic/Immunologic: Negative for environmental allergies.  Neurological: Negative for seizures, syncope, light-headedness and headaches.  Hematological: Negative for adenopathy.  Psychiatric/Behavioral: Negative for agitation, dysphoric mood and suicidal ideas. The patient is not nervous/anxious.     Per HPI unless specifically indicated above     Objective:    BP 112/68 (BP Location: Left Arm, Patient Position: Sitting, Cuff Size: Large)   Pulse 77   Temp 97.7 F (36.5 C) (Other (Comment))   Ht 4' 10.5" (1.486 m)   Wt 152 lb (68.9 kg)   LMP 11/07/2015 (Approximate)   SpO2 98%   BMI 31.23 kg/m   Wt Readings from Last 3 Encounters:  12/07/15 152 lb (68.9 kg)  11/07/15 153 lb (69.4 kg)  08/07/15 153 lb 12 oz (  69.7 kg)    Physical Exam  Constitutional: She is oriented to person, place, and time. She appears well-developed and well-nourished.  HENT:  Head: Normocephalic and atraumatic.  Neck: Neck supple.  Cardiovascular: Normal rate and regular rhythm.   Pulmonary/Chest: Effort normal and breath sounds normal.  Abdominal: Soft. Bowel sounds are normal. She exhibits no mass. There is no  hepatosplenomegaly. There is no tenderness.  Musculoskeletal: She exhibits no edema.  Lymphadenopathy:    She has no cervical adenopathy.  Neurological: She is alert and oriented to person, place, and time.  Skin: Skin is warm and dry.  Psychiatric: She has a normal mood and affect. Her behavior is normal.  Vitals reviewed.       Assessment & Plan:   Encounter Diagnoses  Name Primary?  Marland Kitchen Uncontrolled type 2 diabetes mellitus with complication, unspecified long term insulin use status (HCC) Yes  . Anemia, unspecified type     -counseled pt about warning signs of hypoglycemia and gave reading information -increase insulin to 27 units bid.  Pt to call office for fbs <70 or >300 -follow up 2 months with recheck a1c before appointment. RTO sooner prn

## 2015-12-07 NOTE — Patient Instructions (Signed)
Hipoglucemia (Hypoglycemia) La hipoglucemia se produce cuando el nivel de glucosa en la sangre es demasiado bajo. La glucosa es un tipo de azcar, que es la principal fuente de energa del cuerpo. Hormonas, como la insulina y el glucagn, controlan el nivel de glucosa en la sangre. La insulina reduce el nivel de la glucosa en la sangre, mientras que el glucagn lo aumenta. Si tiene demasiada insulina en el torrente sanguneo o si no ingiere suficientes alimentos que contengan azcar, puede desarrollar hipoglucemia. Esta afeccin puede manifestarse en personas con o sin diabetes. Puede desarrollarse rpidamente y, como consecuencia, necesitar atencin urgente.  CAUSAS   Omitir o retrasar comidas.  No ingerir demasiados carbohidratos en las comidas.  Consumo excesivo de medicamentos para la diabetes.  No coordinar el horario de la toma de medicamentos por va oral para la diabetes o de insulina, con las comidas, las colaciones y el ejercicio.  Nuseas y vmitos.  Algunos medicamentos.  Enfermedades graves, como hepatitis, trastornos renales y ciertos trastornos de la alimentacin.  Aumento de la actividad fsica o el ejercicio, sin ingerir alimentos adicionales o ajustar los medicamentos.  Beber alcohol en exceso.  Un trastorno nervioso que afecta las funciones corporales, como la frecuencia cardaca, presin arterial y digestin (neuropata autnoma).  Una afeccin en la cual los msculos del estmago no funcionan apropiadamente (gastroparesia). Por consiguiente, los medicamentos y los alimentos no pueden absorberse correctamente.  Pocas veces un tumor de pncreas puede producir demasiada insulina. SNTOMAS   Hambre.  Sudoraciones (diaforesis).  Cambio en la temperatura corporal.  Temblores.  Dolor de cabeza.  Ansiedad.  Aturdimiento.  Irritabilidad.  Dificultad para concentrarse.  Sequedad en la boca.  Hormigueo o adormecimiento de las manos y los pies.  Sueo  agitado o alteraciones del sueo.  Alteracin en el habla y la coordinacin.  Cambio en el estado mental.  Convulsiones breves o prolongadas.  Agresividad  Somnolencia (letargo).  Debilidad.  Aumento de la frecuencia cardaca o palpitaciones.  Confusin.  Piel plida o de tono gris.  Visin borrosa o doble.  Desmayos. DIAGNSTICO  Le harn un examen fsico y una historia clnica. Su mdico puede hacer un diagnstico en funcin de sus sntomas. Pueden realizarle anlisis de sangre y otras pruebas de laboratorio para confirmar el diagnstico. Una vez realizado el diagnstico, su mdico observar si los signos y sntomas desaparecen, una vez que aumenta el nivel de la glucosa en la sangre.  TRATAMIENTO  Por lo general, la hipoglucemia puede tratarse fcilmente cuando se observan sntomas.  Controle su nivel de glucosa en la sangre. Si es menor que 70 mg/dl, tome uno de los siguientes:  3 o 4 comprimidos de glucosa.   taza de jugo.   taza de una gaseosa comn.  1 taza de leche descremada.   a 1 pomo de glucosa en gel.  5 a 6 caramelos duros.  Evite las bebidas o los alimentos con alto contenido de grasa, que pueden retrasar el aumento de los niveles de glucosa en la sangre.  No ingiera ms de la cantidad recomendada de alimentos, bebidas, gel o comprimidos que contengan azcar. Si lo hace, el nivel de glucosa en la sangre subir demasiado.  Espere de 10 a 15 minutos y vuelva a controlar su nivel de glucosa en la sangre. Si an es menor que 70 mg/dl o est por debajo del intervalo indicado, repita el tratamiento.  Ingiera una colacin si falta ms de 1 hora para su prxima comida. Es posible que, alguna vez, su   nivel de glucosa en la sangre baje demasiado, de modo que no pueda tratarse en su casa, cuando comience a observar los sntomas. Probablemente necesite ayuda. Incluso puede desmayarse o ser incapaz de tragar. Si no puede tratarse por s solo, alguien Occupational psychologistdeber  llevarlo al hospital.  INSTRUCCIONES PARA EL CUIDADO EN EL HOGAR  Si tiene diabetes, siga su plan de control de la diabetes:  Tome los medicamentos segn las indicaciones.  Siga el plan de ejercicio.  Siga el plan de comidas. No saltee comidas. Coma a horario.  Controle su nivel de glucosa en la sangre peridicamente. Controle su nivel de glucosa en la sangre antes y despus de ejercitarse. Si hace ejercicio durante ms tiempo o de Abbott Laboratoriesmanera diferente de lo habitual, asegrese de Chief Operating Officercontrolar su nivel de glucosa en la sangre con mayor frecuencia.  Use su pulsera o medalla de alerta mdica, que indica que usted tiene diabetes.  Identifique la causa de su hipoglucemia. Luego, desarrolle formas de prevenir la recurrencia de la hipoglucemia.  No tome un bao o una ducha caliente inmediatamente despus de una inyeccin de insulina.  Siempre lleve Advanced Micro Devicesconsigo el tratamiento. Las pastillas de glucosa son fciles de Midwifellevar.  Si va a beber alcohol, bbalo solo con las comidas.  Informe a familiares y [place] Jeffreyamigos qu pueden hacer para mantenerlo seguro durante una convulsin. Esto puede incluir retirar TEPPCO Partnersobjetos duros o filosos del rea o colocarlo de costado.  Mantenga un peso saludable. SOLICITE ATENCIN MDICA SI:   Tiene problemas para Futures tradermantener el nivel de glucosa en la sangre dentro del intervalo indicado.  Tiene episodios frecuentes de hipoglucemia.  Siente efectos secundarios por los medicamentos prescritos.  No est seguro por qu su nivel de glucosa en la sangre es tan bajo.  Nota cambios o un nuevo problema en la visin . SOLICITE ATENCIN MDICA DE INMEDIATO SI:   Presenta confusin.  Se produce un cambio en su estado mental.  Es incapaz de tragar.  Se desmaya.   Esta informacin no tiene Theme park managercomo fin reemplazar el consejo del mdico. Asegrese de hacerle al mdico cualquier pregunta que tenga.   Document Released: 02/04/2005 Document Revised: 02/09/2013 Elsevier Interactive Patient  Education [employer]2016 Elsevier Inc.

## 2015-12-27 ENCOUNTER — Encounter: Payer: Self-pay | Admitting: Physician Assistant

## 2015-12-27 ENCOUNTER — Ambulatory Visit: Payer: Self-pay | Admitting: Physician Assistant

## 2015-12-27 VITALS — BP 116/70 | HR 83 | Temp 97.9°F | Ht 58.5 in | Wt 151.0 lb

## 2015-12-27 DIAGNOSIS — R300 Dysuria: Secondary | ICD-10-CM

## 2015-12-27 DIAGNOSIS — N3090 Cystitis, unspecified without hematuria: Secondary | ICD-10-CM

## 2015-12-27 LAB — POCT URINALYSIS DIPSTICK
Bilirubin, UA: NEGATIVE
Ketones, UA: NEGATIVE
NITRITE UA: NEGATIVE
PH UA: 5.5
PROTEIN UA: NEGATIVE
RBC UA: NEGATIVE
Spec Grav, UA: 1.015
UROBILINOGEN UA: 0.2

## 2015-12-27 MED ORDER — CIPROFLOXACIN HCL 500 MG PO TABS: ORAL_TABLET | ORAL | 0 refills | Status: DC

## 2015-12-27 NOTE — Progress Notes (Signed)
BP 116/70 (BP Location: Left Arm, Patient Position: Sitting, Cuff Size: Normal)   Pulse 83   Temp 97.9 F (36.6 C)   Ht 4' 10.5" (1.486 m)   Wt 151 lb (68.5 kg)   LMP 11/07/2015 (Approximate)   SpO2 99%   BMI 31.02 kg/m    Subjective:    Patient ID: Alice Perez, female    DOB: [birth year], 50 y.o.   MRN: [id number]  HPI: Alice Perez is a 50 y.o. female presenting on 12/27/2015 for Dysuria (and burning when urinating, itching, flank pain, and back pain. pt has tried monistat 7 and states it is not helpful. sx began about one week ago)   HPI   No fever. No n/v.  Relevant past medical, surgical, family and social history reviewed and updated as indicated. Interim medical history since our last visit reviewed. Allergies and medications reviewed and updated.  CURRENT MEDS: Iron novolin 70/30 Lisinopril Metformin Fish oil  Review of Systems  Constitutional: Negative for appetite change, chills, diaphoresis, fatigue, fever and unexpected weight change.  HENT: Negative for congestion, dental problem, drooling, ear pain, facial swelling, hearing loss, mouth sores, sneezing, sore throat, trouble swallowing and voice change.   Eyes: Negative for pain, discharge, redness, itching and visual disturbance.  Respiratory: Negative for cough, choking, shortness of breath and wheezing.   Cardiovascular: Negative for chest pain, palpitations and leg swelling.  Gastrointestinal: Negative for abdominal pain, blood in stool, constipation, diarrhea and vomiting.  Endocrine: Negative for cold intolerance, heat intolerance and polydipsia.  Genitourinary: Positive for decreased urine volume, dysuria and hematuria.  Musculoskeletal: Positive for back pain. Negative for arthralgias and gait problem.  Skin: Negative for rash.  Allergic/Immunologic: Negative for environmental allergies.  Neurological: Negative for seizures, syncope, light-headedness and headaches.  Hematological: Negative for  adenopathy.  Psychiatric/Behavioral: Negative for agitation, dysphoric mood and suicidal ideas. The patient is not nervous/anxious.     Per HPI unless specifically indicated above     Objective:    BP 116/70 (BP Location: Left Arm, Patient Position: Sitting, Cuff Size: Normal)   Pulse 83   Temp 97.9 F (36.6 C)   Ht 4' 10.5" (1.486 m)   Wt 151 lb (68.5 kg)   LMP 11/07/2015 (Approximate)   SpO2 99%   BMI 31.02 kg/m   Wt Readings from Last 3 Encounters:  12/27/15 151 lb (68.5 kg)  12/07/15 152 lb (68.9 kg)  11/07/15 153 lb (69.4 kg)    Physical Exam  Constitutional: She is oriented to person, place, and time. She appears well-developed and well-nourished.  HENT:  Head: Normocephalic and atraumatic.  Neck: Neck supple.  Cardiovascular: Normal rate and regular rhythm.   Pulmonary/Chest: Effort normal and breath sounds normal.  Abdominal: Soft. Bowel sounds are normal. She exhibits no mass. There is no hepatosplenomegaly. There is no tenderness. There is no CVA tenderness.  Musculoskeletal: She exhibits no edema.  Lymphadenopathy:    She has no cervical adenopathy.  Neurological: She is alert and oriented to person, place, and time.  Skin: Skin is warm and dry.  Psychiatric: She has a normal mood and affect. Her behavior is normal.  Vitals reviewed.   Results for orders placed or performed in visit on 12/27/15  POCT Urinalysis Dipstick  Result Value Ref Range   Color, UA LIGHT YELLOW    Clarity, UA CLOUDY    Glucose, UA >=1000 mg/dL    Bilirubin, UA NEG    Ketones, UA NEG    Spec Grav, UA  1.015    Blood, UA NEG    pH, UA 5.5    Protein, UA NEG    Urobilinogen, UA 0.2    Nitrite, UA NEG    Leukocytes, UA Trace (A) Negative      Assessment & Plan:    Encounter Diagnoses  Name Primary?  . Cystitis Yes  . Dysuria      -rx cipro. Push fluids -Gave handout on UTI -Watch blood sugars -F/u as scheduled.  RTO sooner prn worsening or new symptoms

## 2015-12-27 NOTE — Patient Instructions (Signed)
  Infeccin urinaria  (Urinary Tract Infection)  La infeccin urinaria puede ocurrir en cualquier lugar del tracto urinario. El tracto urinario es un sistema de drenaje del cuerpo por el que se eliminan los desechos y el exceso de agua. El tracto urinario est formado por dos riones, dos urteres, la vejiga y la uretra. Los riones son rganos que tienen forma de frijol. Cada rin tiene aproximadamente el tamao del puo. Estn situados debajo de las costillas, uno a cada lado de la columna vertebral CAUSAS  La causa de la infeccin son los microbios, que son organismos microscpicos, que incluyen hongos, virus, y bacterias. Estos organismos son tan pequeos que slo pueden verse a travs del microscopio. Las bacterias son los microorganismos que ms comnmente causan infecciones urinarias.  SNTOMAS  Los sntomas pueden variar segn la edad y el sexo del paciente y por la ubicacin de la infeccin. Los sntomas en las mujeres jvenes incluyen la necesidad frecuente e intensa de orinar y una sensacin dolorosa de ardor en la vejiga o en la uretra durante la miccin. Las mujeres y los hombres mayores podrn sentir cansancio, temblores y debilidad y sentir dolores musculares y dolor abdominal. Si tiene fiebre, puede significar que la infeccin est en los riones. Otros sntomas son dolor en la espalda o en los lados debajo de las costillas, nuseas y vmitos.  DIAGNSTICO  Para diagnosticar una infeccin urinaria, el mdico le preguntar acerca de sus sntomas. Tambin le solicitar una muestra de orina. La muestra de orina se analiza para detectar bacterias y glbulos blancos de la sangre. Los glbulos blancos se forman en el organismo para ayudar a combatir las infecciones.  TRATAMIENTO  Por lo general, las infecciones urinarias pueden tratarse con medicamentos. Debido a que la mayora de las infecciones son causadas por bacterias, por lo general pueden tratarse con antibiticos. La eleccin del  antibitico y la duracin del tratamiento depender de sus sntomas y el tipo de bacteria causante de la infeccin.  INSTRUCCIONES PARA EL CUIDADO EN EL HOGAR   Si le recetaron antibiticos, tmelos exactamente como su mdico le indique. Termine el medicamento aunque se sienta mejor despus de haber tomado slo algunos.  Beba gran cantidad de lquido para mantener la orina de tono claro o color amarillo plido.  Evite la cafena, el t y las bebidas gaseosas. Estas sustancias irritan la vejiga.  Vaciar la vejiga con frecuencia. Evite retener la orina durante largos perodos.  Vace la vejiga antes y despus de tener relaciones sexuales.  Despus de mover el intestino, las mujeres deben higienizarse la regin perineal desde adelante hacia atrs. Use slo un papel tissue por vez. SOLICITE ATENCIN MDICA SI:   Siente dolor en la espalda.  Le sube la fiebre.  Los sntomas no mejoran luego de 3 das. SOLICITE ATENCIN MDICA DE INMEDIATO SI:   Siente dolor intenso en la espalda o en la zona inferior del abdomen.  Comienza a sentir escalofros.  Tiene nuseas o vmitos.  Tiene una sensacin continua de quemazn o molestias al orinar. ASEGRESE DE QUE:   Comprende estas instrucciones.  Controlar su enfermedad.  Solicitar ayuda de inmediato si no mejora o empeora.   Esta informacin no tiene como fin reemplazar el consejo del mdico. Asegrese de hacerle al mdico cualquier pregunta que tenga.   Document Released: 11/14/2004 Document Revised: 10/30/2011 Elsevier Interactive Patient Education 2016 Elsevier Inc.  

## 2016-01-17 ENCOUNTER — Other Ambulatory Visit: Payer: Self-pay | Admitting: Student

## 2016-01-17 DIAGNOSIS — E1165 Type 2 diabetes mellitus with hyperglycemia: Secondary | ICD-10-CM

## 2016-01-17 DIAGNOSIS — E118 Type 2 diabetes mellitus with unspecified complications: Secondary | ICD-10-CM

## 2016-01-17 DIAGNOSIS — D649 Anemia, unspecified: Secondary | ICD-10-CM

## 2016-01-20 LAB — HEMOGLOBIN: HEMOGLOBIN: 12 g/dL (ref 11.7–15.5)

## 2016-01-21 LAB — HEMOGLOBIN A1C
HEMOGLOBIN A1C: 10.7 % — AB (ref <5.7)
MEAN PLASMA GLUCOSE: 260 mg/dL

## 2016-01-25 ENCOUNTER — Encounter: Payer: Self-pay | Admitting: Physician Assistant

## 2016-01-25 ENCOUNTER — Other Ambulatory Visit: Payer: Self-pay | Admitting: Physician Assistant

## 2016-01-25 ENCOUNTER — Ambulatory Visit: Payer: Self-pay | Admitting: Physician Assistant

## 2016-01-25 VITALS — BP 126/70 | HR 85 | Temp 98.1°F | Ht 58.5 in | Wt 148.5 lb

## 2016-01-25 DIAGNOSIS — I10 Essential (primary) hypertension: Secondary | ICD-10-CM

## 2016-01-25 DIAGNOSIS — E118 Type 2 diabetes mellitus with unspecified complications: Principal | ICD-10-CM

## 2016-01-25 DIAGNOSIS — J069 Acute upper respiratory infection, unspecified: Secondary | ICD-10-CM

## 2016-01-25 DIAGNOSIS — Z1239 Encounter for other screening for malignant neoplasm of breast: Secondary | ICD-10-CM

## 2016-01-25 DIAGNOSIS — Z6830 Body mass index (BMI) 30.0-30.9, adult: Secondary | ICD-10-CM

## 2016-01-25 DIAGNOSIS — D649 Anemia, unspecified: Secondary | ICD-10-CM

## 2016-01-25 DIAGNOSIS — E669 Obesity, unspecified: Secondary | ICD-10-CM

## 2016-01-25 DIAGNOSIS — Z124 Encounter for screening for malignant neoplasm of cervix: Secondary | ICD-10-CM

## 2016-01-25 DIAGNOSIS — E785 Hyperlipidemia, unspecified: Secondary | ICD-10-CM

## 2016-01-25 DIAGNOSIS — E1165 Type 2 diabetes mellitus with hyperglycemia: Secondary | ICD-10-CM

## 2016-01-25 MED ORDER — BENZONATATE 100 MG PO CAPS: ORAL_CAPSULE | ORAL | 3 refills | Status: DC

## 2016-01-25 NOTE — Patient Instructions (Signed)
La diabetes mellitus y los alimentos (Diabetes Mellitus and Food) Es importante que controle su nivel de azcar en la sangre (glucosa). El nivel de glucosa en sangre depende en gran medida de lo que usted come. Comer alimentos saludables en las cantidades adecuadas a lo largo del da, aproximadamente a la misma hora todos los das, lo ayudar a controlar su nivel de glucosa en sangre. Tambin puede ayudarlo a retrasar o evitar el empeoramiento de la diabetes mellitus. Comer de manera saludable incluso puede ayudarlo a mejorar el nivel de presin arterial y a alcanzar o mantener un peso saludable. Entre las recomendaciones generales para alimentarse y cocinar los alimentos de forma saludable, se incluyen las siguientes:  Respetar las comidas principales y comer colaciones con regularidad. Evitar pasar largos perodos sin comer con el fin de perder peso.  Seguir una dieta que consista principalmente en alimentos de origen vegetal, como frutas, vegetales, frutos secos, legumbres y cereales integrales.  Utilizar mtodos de coccin a baja temperatura, como hornear, en lugar de mtodos de coccin a alta temperatura, como frer en abundante aceite. Trabaje con el nutricionista para aprender a usar la informacin nutricional de las etiquetas de los alimentos. CMO PUEDEN AFECTARME LOS ALIMENTOS? Carbohidratos Los carbohidratos afectan el nivel de glucosa en sangre ms que cualquier otro tipo de alimento. El nutricionista lo ayudar a determinar cuntos carbohidratos puede consumir en cada comida y ensearle a contarlos. El recuento de carbohidratos es importante para mantener la glucosa en sangre en un nivel saludable, en especial si utiliza insulina o toma determinados medicamentos para la diabetes mellitus. Alcohol El alcohol puede provocar disminuciones sbitas de la glucosa en sangre (hipoglucemia), en especial si utiliza insulina o toma determinados medicamentos para la diabetes mellitus. La  hipoglucemia es una afeccin que puede poner en peligro la vida. Los sntomas de la hipoglucemia (somnolencia, mareos y desorientacin) son similares a los sntomas de haber consumido mucho alcohol. Si el mdico lo autoriza a beber alcohol, hgalo con moderacin y siga estas pautas:  Las mujeres no deben beber ms de un trago por da, y los hombres no deben beber ms de dos tragos por da. Un trago es igual a:  12 onzas (355 ml) de cerveza  5 onzas de vino (150 ml) de vino  1,5onzas (45ml) de bebidas espirituosas  No beba con el estmago vaco.  Mantngase hidratado. Beba agua, gaseosas dietticas o t helado sin azcar.  Las gaseosas comunes, los jugos y otros refrescos podran contener muchos carbohidratos y se deben contar. QU ALIMENTOS NO SE RECOMIENDAN? Cuando haga las elecciones de alimentos, es importante que recuerde que todos los alimentos son distintos. Algunos tienen menos nutrientes que otros por porcin, aunque podran tener la misma cantidad de caloras o carbohidratos. Es difcil darle al cuerpo lo que necesita cuando consume alimentos con menos nutrientes. Estos son algunos ejemplos de alimentos que debera evitar ya que contienen muchas caloras y carbohidratos, pero pocos nutrientes:  Grasas trans (la mayora de los alimentos procesados incluyen grasas trans en la etiqueta de Informacin nutricional).  Gaseosas comunes.  Jugos.  Caramelos.  Dulces, como tortas, pasteles, rosquillas y galletas.  Comidas fritas. QU ALIMENTOS PUEDO COMER? Consuma alimentos ricos en nutrientes, que nutrirn el cuerpo y lo mantendrn saludable. Los alimentos que debe comer tambin dependern de varios factores, como:  Las caloras que necesita.  Los medicamentos que toma.  Su peso.  El nivel de glucosa en sangre.  El nivel de presin arterial.  El nivel de colesterol. Debe consumir   una amplia variedad de alimentos, por ejemplo:  Protenas.  Cortes de carne  magros.  Protenas con bajo contenido de grasas saturadas, como pescado, clara de huevo y frijoles. Evite las carnes procesadas.  Frutas y vegetales.  Frutas y vegetales que pueden ayudar a controlar los niveles sanguneos de glucosa, como manzanas, mangos y batatas.  Productos lcteos.  Elija productos lcteos sin grasa o con bajo contenido de grasa, como leche, yogur y queso.  Cereales, panes, pastas y arroz.  Elija cereales integrales, como panes multicereales, avena en grano y arroz integral. Estos alimentos pueden ayudar a controlar la presin arterial.  Grasas.  Alimentos que contengan grasas saludables, como frutos secos, aguacate, aceite de oliva, aceite de canola y pescado. TODOS LOS QUE PADECEN DIABETES MELLITUS TIENEN EL MISMO PLAN DE COMIDAS? Dado que todas las personas que padecen diabetes mellitus son distintas, no hay un solo plan de comidas que funcione para todos. Es muy importante que se rena con un nutricionista que lo ayudar a crear un plan de comidas adecuado para usted. Esta informacin no tiene como fin reemplazar el consejo del mdico. Asegrese de hacerle al mdico cualquier pregunta que tenga. Document Released: 05/14/2007 Document Revised: 02/25/2014 Document Reviewed: 01/01/2013 Elsevier Interactive Patient Education  2017 Elsevier Inc.  

## 2016-01-25 NOTE — Progress Notes (Signed)
BP 126/70 (BP Location: Left Arm, Patient Position: Sitting, Cuff Size: Normal)   Pulse 85   Temp 98.1 F (36.7 C) (Other (Comment))   Ht 4' 10.5" (1.486 m)   Wt 148 lb 8 oz (67.4 kg)   LMP 01/12/2016 (Exact Date)   SpO2 98%   BMI 30.51 kg/m    Subjective:    Patient ID: Alice Perez, female    DOB: 1965-07-01, 50 y.o.   MRN: [id number]  HPI: Alice Perez is a 50 y.o. female presenting on 01/25/2016 for Diabetes and Gynecologic Exam   HPI   Reviewed bs log- all over place- some 251 others 92.    Pt with cough and cold x few days. No fever, sob.   Relevant past medical, surgical, family and social history reviewed and updated as indicated. Interim medical history since our last visit reviewed. Allergies and medications reviewed and updated.   Current Outpatient Prescriptions:  .  ferrous sulfate 325 (65 FE) MG tablet, Take 325 mg by mouth daily., Disp: , Rfl:  .  insulin NPH-regular Human (NOVOLIN 70/30 RELION) (70-30) 100 UNIT/ML injection, Inject 15 units subcutaneously every morning with breakfast and 14 units every evening with supper.   inyecte 15 unidades subcutaneo cada maana con el desayuno y 14 unidades cada tarde con la sena (Patient taking differently: Inject 27 Units into the skin 2 (two) times daily. Inject 15 units subcutaneously every morning with breakfast and 14 units every evening with supper.   inyecte 15 unidades subcutaneo cada maana con el desayuno y 14 unidades cada tarde con la sena), Disp: 1 vial, Rfl: 3 .  lisinopril (PRINIVIL,ZESTRIL) 10 MG tablet, Take 1 tablet (10 mg total) by mouth daily. Tome una tableta por boca diaria, Disp: 30 tablet, Rfl: 5 .  metFORMIN (GLUCOPHAGE) 1000 MG tablet, Take 1 tablet (1,000 mg total) by mouth 2 (two) times daily with a meal. Tome una tableta por boca dos veces diarias con comida, Disp: 60 tablet, Rfl: 4 .  Omega-3 Fatty Acids (FISH OIL PO), Take 1,200 mg by mouth daily. , Disp: , Rfl:    Review of Systems   Constitutional: Negative for appetite change, chills, diaphoresis, fatigue, fever and unexpected weight change.  HENT: Negative for congestion, dental problem, drooling, ear pain, facial swelling, hearing loss, mouth sores, sneezing, sore throat, trouble swallowing and voice change.   Eyes: Negative for pain, discharge, redness, itching and visual disturbance.  Respiratory: Negative for cough, choking, shortness of breath and wheezing.   Cardiovascular: Negative for chest pain, palpitations and leg swelling.  Gastrointestinal: Negative for abdominal pain, blood in stool, constipation, diarrhea and vomiting.  Endocrine: Negative for cold intolerance, heat intolerance and polydipsia.  Genitourinary: Negative for decreased urine volume, dysuria and hematuria.  Musculoskeletal: Negative for arthralgias, back pain and gait problem.  Skin: Negative for rash.  Allergic/Immunologic: Negative for environmental allergies.  Neurological: Negative for seizures, syncope, light-headedness and headaches.  Hematological: Negative for adenopathy.  Psychiatric/Behavioral: Negative for agitation, dysphoric mood and suicidal ideas. The patient is not nervous/anxious.     Per HPI unless specifically indicated above     Objective:    BP 126/70 (BP Location: Left Arm, Patient Position: Sitting, Cuff Size: Normal)   Pulse 85   Temp 98.1 F (36.7 C) (Other (Comment))   Ht 4' 10.5" (1.486 m)   Wt 148 lb 8 oz (67.4 kg)   LMP 01/12/2016 (Exact Date)   SpO2 98%   BMI 30.51 kg/m   Wt Readings  from Last 3 Encounters:  01/25/16 148 lb 8 oz (67.4 kg)  12/27/15 151 lb (68.5 kg)  12/07/15 152 lb (68.9 kg)    Physical Exam  Constitutional: She is oriented to person, place, and time. She appears well-developed and well-nourished.  HENT:  Head: Normocephalic and atraumatic.  Right Ear: Hearing, tympanic membrane, external ear and ear canal normal.  Left Ear: Hearing, tympanic membrane, external ear and ear  canal normal.  Nose: Nose normal.  Mouth/Throat: Uvula is midline and oropharynx is clear and moist. No oropharyngeal exudate.  Neck: Neck supple.  Cardiovascular: Normal rate and regular rhythm.   Pulmonary/Chest: Effort normal and breath sounds normal. She has no wheezes.  Breast exam normal  Abdominal: Soft. Bowel sounds are normal. She exhibits no mass. There is no hepatosplenomegaly. There is no tenderness. There is no rebound and no guarding.  Genitourinary: Vagina normal and uterus normal. No breast swelling, tenderness, discharge or bleeding. There is no rash, tenderness or lesion on the right labia. There is no rash, tenderness or lesion on the left labia. Cervix exhibits no motion tenderness, no discharge and no friability. Right adnexum displays no mass, no tenderness and no fullness. Left adnexum displays no mass, no tenderness and no fullness.  Genitourinary Comments: (nurse Berenice assisted)  Musculoskeletal: She exhibits no edema.  Lymphadenopathy:    She has no cervical adenopathy.  Neurological: She is alert and oriented to person, place, and time.  Skin: Skin is warm and dry.  Psychiatric: She has a normal mood and affect. Her behavior is normal.  Nursing note and vitals reviewed.   Results for orders placed or performed in visit on 01/17/16  Hemoglobin  Result Value Ref Range   Hemoglobin 12.0 11.7 - 15.5 g/dL  HgB O1HA1c  Result Value Ref Range   Hgb A1c MFr Bld 10.7 (H) <5.7 %   Mean Plasma Glucose 260 mg/dL      Assessment & Plan:   Encounter Diagnoses  Name Primary?  Marland Kitchen. Uncontrolled type 2 diabetes mellitus with complication, unspecified long term insulin use status (HCC) Yes  . Routine Papanicolaou smear   . Acute upper respiratory infection   . Screening for breast cancer   . Essential hypertension, benign   . Hyperlipidemia, unspecified hyperlipidemia type   . Class 1 obesity with body mass index (BMI) of 30.0 to 30.9 in adult, unspecified obesity type,  unspecified whether serious comorbidity present   . Anemia, unspecified type     -reviewed labs with pt -order screening Mammogram -rx Tessalon for cough. Counseled on rest, fluids, otc's for URI -continue current meds for diabetes including 27 units insulin bid -counseled pt on diabetic diet and gave handout -discussed with pt that bs log values are all over the place, likely due to diet.  Counseled on watching the diet to lower numbers and control the DM better -follow up 3 months.  RTO sooner prn

## 2016-01-26 LAB — PAP, THINPREP RFLX HPV

## 2016-02-01 ENCOUNTER — Other Ambulatory Visit: Payer: Self-pay | Admitting: Physician Assistant

## 2016-02-28 ENCOUNTER — Ambulatory Visit (HOSPITAL_COMMUNITY)
Admission: RE | Admit: 2016-02-28 | Discharge: 2016-02-28 | Disposition: A | Discharge status: Home / Self Care | Payer: Self-pay | Source: Ambulatory Visit | Attending: Physician Assistant | Admitting: Physician Assistant

## 2016-04-15 ENCOUNTER — Other Ambulatory Visit: Payer: Self-pay | Admitting: Student

## 2016-04-15 DIAGNOSIS — E785 Hyperlipidemia, unspecified: Secondary | ICD-10-CM

## 2016-04-15 DIAGNOSIS — E118 Type 2 diabetes mellitus with unspecified complications: Principal | ICD-10-CM

## 2016-04-15 DIAGNOSIS — E1165 Type 2 diabetes mellitus with hyperglycemia: Secondary | ICD-10-CM

## 2016-04-15 DIAGNOSIS — I10 Essential (primary) hypertension: Secondary | ICD-10-CM

## 2016-04-15 DIAGNOSIS — D649 Anemia, unspecified: Secondary | ICD-10-CM

## 2016-04-20 LAB — HEMOGLOBIN: Hemoglobin: 12.5 g/dL (ref 11.7–15.5)

## 2016-04-21 LAB — COMPREHENSIVE METABOLIC PANEL WITH GFR
ALT: 18 U/L (ref 6–29)
AST: 18 U/L (ref 10–35)
Albumin: 3.8 g/dL (ref 3.6–5.1)
Alkaline Phosphatase: 107 U/L (ref 33–115)
BUN: 9 mg/dL (ref 7–25)
CO2: 24 mmol/L (ref 20–31)
Calcium: 9.2 mg/dL (ref 8.6–10.2)
Chloride: 101 mmol/L (ref 98–110)
Creat: 0.55 mg/dL (ref 0.50–1.10)
Glucose, Bld: 177 mg/dL — ABNORMAL HIGH (ref 65–99)
Potassium: 4.2 mmol/L (ref 3.5–5.3)
Sodium: 135 mmol/L (ref 135–146)
Total Bilirubin: 0.3 mg/dL (ref 0.2–1.2)
Total Protein: 7.2 g/dL (ref 6.1–8.1)

## 2016-04-21 LAB — LIPID PANEL
CHOL/HDL RATIO: 3.8 ratio (ref <5.0)
Cholesterol: 164 mg/dL (ref <200)
HDL: 43 mg/dL — ABNORMAL LOW (ref >50)
LDL CALC: 95 mg/dL (ref <100)
TRIGLYCERIDES: 130 mg/dL (ref <150)
VLDL: 26 mg/dL (ref <30)

## 2016-04-22 LAB — HEMOGLOBIN A1C
HEMOGLOBIN A1C: 10.3 % — AB (ref <5.7)
MEAN PLASMA GLUCOSE: 249 mg/dL

## 2016-04-24 ENCOUNTER — Ambulatory Visit: Payer: Self-pay | Admitting: Physician Assistant

## 2016-04-24 ENCOUNTER — Encounter: Payer: Self-pay | Admitting: Physician Assistant

## 2016-04-24 VITALS — BP 116/70 | HR 74 | Temp 97.7°F | Ht 58.5 in | Wt 149.5 lb

## 2016-04-24 DIAGNOSIS — Z6830 Body mass index (BMI) 30.0-30.9, adult: Secondary | ICD-10-CM

## 2016-04-24 DIAGNOSIS — E1165 Type 2 diabetes mellitus with hyperglycemia: Secondary | ICD-10-CM

## 2016-04-24 DIAGNOSIS — E118 Type 2 diabetes mellitus with unspecified complications: Principal | ICD-10-CM

## 2016-04-24 DIAGNOSIS — E669 Obesity, unspecified: Secondary | ICD-10-CM

## 2016-04-24 DIAGNOSIS — D649 Anemia, unspecified: Secondary | ICD-10-CM

## 2016-04-24 DIAGNOSIS — I10 Essential (primary) hypertension: Secondary | ICD-10-CM

## 2016-04-24 DIAGNOSIS — E785 Hyperlipidemia, unspecified: Secondary | ICD-10-CM

## 2016-04-24 NOTE — Progress Notes (Signed)
BP 116/70 (BP Location: Left Arm, Patient Position: Sitting, Cuff Size: Normal)   Pulse 74   Temp 97.7 F (36.5 C) (Other (Comment))   Ht 4' 10.5" (1.486 m)   Wt 149 lb 8 oz (67.8 kg)   LMP 03/04/2016 (Approximate)   SpO2 98%   BMI 30.71 kg/m    Subjective:    Patient ID: Alice Perez, female    DOB: 04-Aug-1965, 51 y.o.   MRN: [id number]  HPI: Alice Mcgregorlma Perez is a 51 y.o. female presenting on 04/24/2016 for Diabetes; Anemia; and Hypertension   HPI   Pt currently using 25u of novolin 70/30 bid.  bs log reviewed-  from dec 8, only one reading over 200, a handful in the upper 90s, the remaining are in the one hundreds.  Pt is feeling well.   Relevant past medical, surgical, family and social history reviewed and updated as indicated. Interim medical history since our last visit reviewed. Allergies and medications reviewed and updated.   Current Outpatient Prescriptions:  .  ferrous sulfate 325 (65 FE) MG tablet, Take 325 mg by mouth daily., Disp: , Rfl:  .  insulin NPH-regular Human (NOVOLIN 70/30 RELION) (70-30) 100 UNIT/ML injection, Inject 15 units subcutaneously every morning with breakfast and 14 units every evening with supper.   inyecte 15 unidades subcutaneo cada maana con el desayuno y 14 unidades cada tarde con la sena (Patient taking differently: Inject 25 Units into the skin 2 (two) times daily. Inject 15 units subcutaneously every morning with breakfast and 14 units every evening with supper.   inyecte 15 unidades subcutaneo cada maana con el desayuno y 14 unidades cada tarde con la sena), Disp: 1 vial, Rfl: 3 .  lisinopril (PRINIVIL,ZESTRIL) 10 MG tablet, 1 po qd.  Tome una tableta por boca diaria, Disp: 30 tablet, Rfl: 5 .  metFORMIN (GLUCOPHAGE) 1000 MG tablet, Take 1 tablet (1,000 mg total) by mouth 2 (two) times daily with a meal. Tome una tableta por boca dos veces diarias con comida, Disp: 60 tablet, Rfl: 4 .  Omega-3 Fatty Acids (FISH OIL PO), Take 1,200 mg by  mouth daily. , Disp: , Rfl:    Review of Systems  Constitutional: Negative for appetite change, chills, diaphoresis, fatigue, fever and unexpected weight change.  HENT: Negative for congestion, dental problem, drooling, ear pain, facial swelling, hearing loss, mouth sores, sneezing, sore throat, trouble swallowing and voice change.   Eyes: Negative for pain, discharge, redness, itching and visual disturbance.  Respiratory: Negative for cough, choking, shortness of breath and wheezing.   Cardiovascular: Negative for chest pain, palpitations and leg swelling.  Gastrointestinal: Negative for abdominal pain, blood in stool, constipation, diarrhea and vomiting.  Endocrine: Negative for cold intolerance, heat intolerance and polydipsia.  Genitourinary: Negative for decreased urine volume, dysuria and hematuria.  Musculoskeletal: Negative for arthralgias, back pain and gait problem.  Skin: Negative for rash.  Allergic/Immunologic: Negative for environmental allergies.  Neurological: Negative for seizures, syncope, light-headedness and headaches.  Hematological: Negative for adenopathy.  Psychiatric/Behavioral: Negative for agitation, dysphoric mood and suicidal ideas. The patient is not nervous/anxious.     Per HPI unless specifically indicated above     Objective:    BP 116/70 (BP Location: Left Arm, Patient Position: Sitting, Cuff Size: Normal)   Pulse 74   Temp 97.7 F (36.5 C) (Other (Comment))   Ht 4' 10.5" (1.486 m)   Wt 149 lb 8 oz (67.8 kg)   LMP 03/04/2016 (Approximate)   SpO2 98%  BMI 30.71 kg/m   Wt Readings from Last 3 Encounters:  04/24/16 149 lb 8 oz (67.8 kg)  01/25/16 148 lb 8 oz (67.4 kg)  12/27/15 151 lb (68.5 kg)    Physical Exam  Constitutional: She is oriented to person, place, and time. She appears well-developed and well-nourished.  HENT:  Head: Normocephalic and atraumatic.  Neck: Neck supple.  Cardiovascular: Normal rate and regular rhythm.    Pulmonary/Chest: Effort normal and breath sounds normal.  Abdominal: Soft. Bowel sounds are normal. She exhibits no mass. There is no hepatosplenomegaly. There is no tenderness.  Musculoskeletal: She exhibits no edema.  Lymphadenopathy:    She has no cervical adenopathy.  Neurological: She is alert and oriented to person, place, and time.  Skin: Skin is warm and dry.  Psychiatric: She has a normal mood and affect. Her behavior is normal.  Vitals reviewed.   Results for orders placed or performed in visit on 04/15/16  Comprehensive metabolic panel  Result Value Ref Range   Sodium 135 135 - 146 mmol/L   Potassium 4.2 3.5 - 5.3 mmol/L   Chloride 101 98 - 110 mmol/L   CO2 24 20 - 31 mmol/L   Glucose, Bld 177 (H) 65 - 99 mg/dL   BUN 9 7 - 25 mg/dL   Creat 1.61 0.96 - 0.45 mg/dL   Total Bilirubin 0.3 0.2 - 1.2 mg/dL   Alkaline Phosphatase 107 33 - 115 U/L   AST 18 10 - 35 U/L   ALT 18 6 - 29 U/L   Total Protein 7.2 6.1 - 8.1 g/dL   Albumin 3.8 3.6 - 5.1 g/dL   Calcium 9.2 8.6 - 40.9 mg/dL  Hemoglobin  Result Value Ref Range   Hemoglobin 12.5 11.7 - 15.5 g/dL  Lipid panel  Result Value Ref Range   Cholesterol 164 <200 mg/dL   Triglycerides 811 <914 mg/dL   HDL 43 (L) >78 mg/dL   Total CHOL/HDL Ratio 3.8 <5.0 Ratio   VLDL 26 <30 mg/dL   LDL Cholesterol 95 <295 mg/dL  Hemoglobin A2Z  Result Value Ref Range   Hgb A1c MFr Bld 10.3 (H) <5.7 %   Mean Plasma Glucose 249 mg/dL      Assessment & Plan:   Encounter Diagnoses  Name Primary?  Marland Kitchen Uncontrolled type 2 diabetes mellitus with complication, unspecified long term insulin use status (HCC) Yes  . Essential hypertension, benign   . Hyperlipidemia, unspecified hyperlipidemia type   . Anemia, unspecified type   . Class 1 obesity with body mass index (BMI) of 30.0 to 30.9 in adult, unspecified obesity type, unspecified whether serious comorbidity present     -reviewed labs with pt -Increase novolin 70/30 to 30 units bid.   Reminded pt to call office for fbs < 70 or > 300 -discussed with pt may need referral to endocrinology if a1c still remains high at subsequent office visit -continue other medications -follow up 3 months.  RTO sooner prn

## 2016-05-15 ENCOUNTER — Other Ambulatory Visit: Payer: Self-pay | Admitting: Physician Assistant

## 2016-05-15 MED ORDER — METFORMIN HCL 1000 MG PO TABS: 1000.0000 mg | ORAL_TABLET | Freq: Two times a day (BID) | ORAL | 4 refills | Status: DC

## 2016-05-21 ENCOUNTER — Encounter: Payer: Self-pay | Admitting: Physician Assistant

## 2016-07-25 ENCOUNTER — Ambulatory Visit: Payer: Self-pay | Admitting: Physician Assistant

## 2016-08-07 ENCOUNTER — Encounter: Payer: Self-pay | Admitting: Physician Assistant

## 2016-12-03 ENCOUNTER — Ambulatory Visit: Payer: Self-pay | Admitting: Physician Assistant

## 2020-02-15 ENCOUNTER — Encounter: Payer: Self-pay | Admitting: Physician Assistant

## 2020-02-15 ENCOUNTER — Other Ambulatory Visit: Payer: Self-pay

## 2020-02-15 ENCOUNTER — Other Ambulatory Visit: Payer: Self-pay | Admitting: Physician Assistant

## 2020-02-15 ENCOUNTER — Other Ambulatory Visit (HOSPITAL_COMMUNITY)
Admission: RE | Admit: 2020-02-15 | Discharge: 2020-02-15 | Disposition: A | Discharge status: Home / Self Care | Payer: Self-pay | Source: Ambulatory Visit | Attending: Physician Assistant | Admitting: Physician Assistant

## 2020-02-15 ENCOUNTER — Other Ambulatory Visit: Payer: Self-pay | Admitting: Student

## 2020-02-15 ENCOUNTER — Ambulatory Visit: Payer: Self-pay | Admitting: Physician Assistant

## 2020-02-15 VITALS — BP 140/80 | HR 81 | Temp 97.1°F | Ht 58.5 in | Wt 126.5 lb

## 2020-02-15 DIAGNOSIS — I10 Essential (primary) hypertension: Secondary | ICD-10-CM

## 2020-02-15 DIAGNOSIS — IMO0002 Reserved for concepts with insufficient information to code with codable children: Secondary | ICD-10-CM

## 2020-02-15 DIAGNOSIS — Z789 Other specified health status: Secondary | ICD-10-CM

## 2020-02-15 DIAGNOSIS — E118 Type 2 diabetes mellitus with unspecified complications: Secondary | ICD-10-CM | POA: Insufficient documentation

## 2020-02-15 DIAGNOSIS — Z1211 Encounter for screening for malignant neoplasm of colon: Secondary | ICD-10-CM

## 2020-02-15 DIAGNOSIS — Z862 Personal history of diseases of the blood and blood-forming organs and certain disorders involving the immune mechanism: Secondary | ICD-10-CM

## 2020-02-15 DIAGNOSIS — Z7689 Persons encountering health services in other specified circumstances: Secondary | ICD-10-CM

## 2020-02-15 DIAGNOSIS — Z13220 Encounter for screening for lipoid disorders: Secondary | ICD-10-CM

## 2020-02-15 DIAGNOSIS — E1165 Type 2 diabetes mellitus with hyperglycemia: Secondary | ICD-10-CM | POA: Insufficient documentation

## 2020-02-15 DIAGNOSIS — Z1239 Encounter for other screening for malignant neoplasm of breast: Secondary | ICD-10-CM

## 2020-02-15 LAB — COMPREHENSIVE METABOLIC PANEL
ALT: 20 U/L (ref 0–44)
AST: 21 U/L (ref 15–41)
Albumin: 3.7 g/dL (ref 3.5–5.0)
Alkaline Phosphatase: 129 U/L — ABNORMAL HIGH (ref 38–126)
Anion gap: 10 (ref 5–15)
BUN: 14 mg/dL (ref 6–20)
CO2: 23 mmol/L (ref 22–32)
Calcium: 9 mg/dL (ref 8.9–10.3)
Chloride: 101 mmol/L (ref 98–111)
Creatinine, Ser: 0.39 mg/dL — ABNORMAL LOW (ref 0.44–1.00)
GFR, Estimated: >60 mL/min (ref >60)
Glucose, Bld: 333 mg/dL — ABNORMAL HIGH (ref 70–99)
Potassium: 3.7 mmol/L (ref 3.5–5.1)
Sodium: 134 mmol/L — ABNORMAL LOW (ref 135–145)
Total Bilirubin: 0.9 mg/dL (ref 0.3–1.2)
Total Protein: 7.6 g/dL (ref 6.5–8.1)

## 2020-02-15 LAB — LIPID PANEL
Cholesterol: 205 mg/dL — ABNORMAL HIGH (ref 0–200)
HDL: 45 mg/dL (ref >40)
LDL Cholesterol: 131 mg/dL — ABNORMAL HIGH (ref 0–99)
Total CHOL/HDL Ratio: 4.6 RATIO
Triglycerides: 143 mg/dL (ref <150)
VLDL: 29 mg/dL (ref 0–40)

## 2020-02-15 LAB — CBC
HCT: 40 % (ref 36.0–46.0)
Hemoglobin: 13.6 g/dL (ref 12.0–15.0)
MCH: 29 pg (ref 26.0–34.0)
MCHC: 34 g/dL (ref 30.0–36.0)
MCV: 85.3 fL (ref 80.0–100.0)
Platelets: 280 10*3/uL (ref 150–400)
RBC: 4.69 MIL/uL (ref 3.87–5.11)
RDW: 11.8 % (ref 11.5–15.5)
WBC: 6.7 10*3/uL (ref 4.0–10.5)
nRBC: 0 % (ref 0.0–0.2)

## 2020-02-15 LAB — HEMOGLOBIN A1C
Hgb A1c MFr Bld: 12.5 % — ABNORMAL HIGH (ref 4.8–5.6)
Mean Plasma Glucose: 312.05 mg/dL

## 2020-02-15 LAB — IFOBT (OCCULT BLOOD): IFOBT: NEGATIVE

## 2020-02-15 LAB — GLUCOSE, POCT (MANUAL RESULT ENTRY): POC Glucose: 400 mg/dl — AB (ref 70–99)

## 2020-02-15 MED ORDER — METFORMIN HCL 1000 MG PO TABS: 1000.0000 mg | ORAL_TABLET | Freq: Two times a day (BID) | ORAL | 1 refills | Status: DC

## 2020-02-15 MED ORDER — NOVOLIN 70/30 (70-30) 100 UNIT/ML ~~LOC~~ SUSP: SUBCUTANEOUS | 1 refills | Status: DC

## 2020-02-15 MED ORDER — LISINOPRIL 10 MG PO TABS: 10.0000 mg | ORAL_TABLET | Freq: Every day | ORAL | 1 refills | Status: DC

## 2020-02-15 NOTE — Progress Notes (Signed)
BP 140/80   Pulse 81   Temp (!) 97.1 F (36.2 C)   Ht 4' 10.5" (1.486 m)   Wt 126 lb 8 oz (57.4 kg)   SpO2 98%   BMI 25.99 kg/m    Subjective:    Patient ID: Alice Perez, female    DOB: August 03, 1965, 54 y.o.   MRN: [id number]  HPI: Alice Perez is a 54 y.o. female presenting on 02/15/2020 for New Patient (Initial Visit) (Pt was here 04/2016. Pt Is here to re-establish care. Pt states she was seen by a Doctor near the [facility] agency in [place] [employer] pt is unsure of provider's name. Pt was last seen there over a year ago. Pt states she stopped going there due to having no insurance and it not being affordable.)   HPI   Pt had a negative covid 19 screening questionnaire.   Pt is 54yoF who presents to re-establish care.  She was Last seen here 04/24/2016.  She feels a little bad.   Chief Complaint  Patient presents with  . New Patient (Initial Visit)    Pt was here 04/2016. Pt Is here to re-establish care. Pt states she was seen by a Doctor near the [facility] licence plate agency in [place] [employer] pt is unsure of provider's name. Pt was last seen there over a year ago. Pt states she stopped going there due to having no insurance and it not being affordable.        Relevant past medical, surgical, family and social history reviewed and updated as indicated. Interim medical history since our last visit reviewed. Allergies and medications reviewed and updated.   Current Outpatient Medications:  .  Omega-3 Fatty Acids (FISH OIL PO), Take 1 capsule by mouth 2 (two) times daily., Disp: , Rfl:     Review of Systems  Per HPI unless specifically indicated above     Objective:    BP 140/80   Pulse 81   Temp (!) 97.1 F (36.2 C)   Ht 4' 10.5" (1.486 m)   Wt 126 lb 8 oz (57.4 kg)   SpO2 98%   BMI 25.99 kg/m   Wt Readings from Last 3 Encounters:  02/15/20 126 lb 8 oz (57.4 kg)  04/24/16 149 lb 8 oz (67.8 kg)  01/25/16 148 lb 8 oz (67.4 kg)     Physical Exam Vitals reviewed.  Constitutional:      General: She is not in acute distress.    Appearance: She is well-developed and well-nourished. She is not ill-appearing.  HENT:     Head: Normocephalic and atraumatic.     Mouth/Throat:     Mouth: Oropharynx is clear and moist.  Eyes:     Extraocular Movements: EOM normal.     Conjunctiva/sclera: Conjunctivae normal.     Pupils: Pupils are equal, round, and reactive to light.  Neck:     Thyroid: No thyromegaly.  Cardiovascular:     Rate and Rhythm: Normal rate and regular rhythm.  Pulmonary:     Effort: Pulmonary effort is normal.     Breath sounds: Normal breath sounds.  Abdominal:     General: Bowel sounds are normal.     Palpations: Abdomen is soft. There is no hepatosplenomegaly or mass.     Tenderness: There is no abdominal tenderness.  Musculoskeletal:        General: No edema.     Cervical back: Neck supple.     Right lower leg: No  edema.     Left lower leg: No edema.  Lymphadenopathy:     Cervical: No cervical adenopathy.  Skin:    General: Skin is warm and dry.  Neurological:     Mental Status: She is alert and oriented to person, place, and time.     Gait: Gait normal.  Psychiatric:        Mood and Affect: Mood and affect normal.        Behavior: Behavior normal.           Assessment & Plan:    Encounter Diagnoses  Name Primary?  . Encounter to establish care Yes  . Uncontrolled type 2 diabetes mellitus with complication (HCC)   . Primary hypertension   . Screening cholesterol level   . Screening for colon cancer   . Encounter for screening for malignant neoplasm of breast, unspecified screening modality   . History of anemia   . Not proficient in English language      -Update labs -refer for screening mammogram  -DM Foot exam was updated -will update Pap at next appointment -pt was given ifobt for colon cancer screening -will restart lisinopril and metformin and novolin.  Pt is  counseled to monitor her blood sugars and call office for fbs < 70 or > 300.  S -pt to follow up with bs log 3 weeks.  She is to contact office sooner prn

## 2020-02-16 LAB — MICROALBUMIN, URINE: Microalb, Ur: 32.7 ug/mL — ABNORMAL HIGH

## 2020-03-06 ENCOUNTER — Ambulatory Visit (HOSPITAL_COMMUNITY): Payer: Self-pay

## 2020-03-08 ENCOUNTER — Ambulatory Visit: Payer: Self-pay | Admitting: Physician Assistant

## 2020-03-14 ENCOUNTER — Encounter: Payer: Self-pay | Admitting: Physician Assistant

## 2020-03-14 ENCOUNTER — Ambulatory Visit: Payer: Self-pay | Admitting: Physician Assistant

## 2020-03-14 ENCOUNTER — Other Ambulatory Visit: Payer: Self-pay

## 2020-03-14 DIAGNOSIS — E785 Hyperlipidemia, unspecified: Secondary | ICD-10-CM

## 2020-03-14 DIAGNOSIS — Z20822 Contact with and (suspected) exposure to covid-19: Secondary | ICD-10-CM

## 2020-03-14 DIAGNOSIS — R6883 Chills (without fever): Secondary | ICD-10-CM

## 2020-03-14 DIAGNOSIS — R52 Pain, unspecified: Secondary | ICD-10-CM

## 2020-03-14 DIAGNOSIS — I10 Essential (primary) hypertension: Secondary | ICD-10-CM

## 2020-03-14 DIAGNOSIS — E1165 Type 2 diabetes mellitus with hyperglycemia: Secondary | ICD-10-CM

## 2020-03-14 DIAGNOSIS — R0981 Nasal congestion: Secondary | ICD-10-CM

## 2020-03-14 DIAGNOSIS — Z789 Other specified health status: Secondary | ICD-10-CM

## 2020-03-14 DIAGNOSIS — IMO0002 Reserved for concepts with insufficient information to code with codable children: Secondary | ICD-10-CM

## 2020-03-14 DIAGNOSIS — R197 Diarrhea, unspecified: Secondary | ICD-10-CM

## 2020-03-14 NOTE — Progress Notes (Incomplete)
There were no vitals taken for this visit.   Subjective:    Patient ID: Alice Perez, female    DOB: 12-16-1965, 55 y.o.   MRN: [id number]  HPI: Kaileah Perez is a 55 y.o. female presenting on 03/14/2020 for No chief complaint on file.   HPI   Pt was scheduled for in-person appointment but was changed to virtual appointment due to her symptoms.  This is a telemedicine appointment through [employer] due to coronavirus pandemic  I connected with  Alice Perez on 03/14/20 by a video enabled telemedicine application and verified that I am speaking with the correct person using two identifiers.   I discussed the limitations of evaluation and management by telemedicine. The patient expressed understanding and agreed to proceed.  Pt is in her parked car.  Provider and translator are in office.     Pt is having chills and body aches and congestion.  She hasn't checked her temperature.  She has diarrhea also. She satred feeling bad yesterday She got first 2 doses covid vaccination but hasn't yet gotten booster No known exposures She does not work Her grandaughter is sick and her daughter had exposure at work.  She lives with both.   She was doing well before yesterday.   Her bs at home have been running .  307 is highest.  Lower in afternoon lowest 170.      Relevant past medical, surgical, family and social history reviewed and updated as indicated. Interim medical history since our last visit reviewed. Allergies and medications reviewed and updated.   Current Outpatient Medications:  .  insulin NPH-regular Human (NOVOLIN 70/30) (70-30) 100 UNIT/ML injection, Inject 10 units subcutaneously twice daily before meals.  inyecte 10 unidades subcutaneos dos veces diarios antes de comer, Disp: 10 mL, Rfl: 1 .  lisinopril (ZESTRIL) 10 MG tablet, Take 1 tablet (10 mg total) by mouth daily., Disp: 30 tablet, Rfl: 1 .  metFORMIN (GLUCOPHAGE) 1000 MG tablet, Take 1 tablet (1,000 mg total) by  mouth 2 (two) times daily with a meal., Disp: 60 tablet, Rfl: 1 .  Omega-3 Fatty Acids (FISH OIL PO), Take 1 capsule by mouth 2 (two) times daily., Disp: , Rfl:     Review of Systems  Per HPI unless specifically indicated above     Objective:    There were no vitals taken for this visit.  Wt Readings from Last 3 Encounters:  02/15/20 126 lb 8 oz (57.4 kg)  04/24/16 149 lb 8 oz (67.8 kg)  01/25/16 148 lb 8 oz (67.4 kg)    Physical Exam  Results for orders placed or performed during the hospital encounter of 02/15/20  CBC  Result Value Ref Range   WBC 6.7 4.0 - 10.5 K/uL   RBC 4.69 3.87 - 5.11 MIL/uL   Hemoglobin 13.6 12.0 - 15.0 g/dL   HCT 50.2 77.4 - 12.8 %   MCV 85.3 80.0 - 100.0 fL   MCH 29.0 26.0 - 34.0 pg   MCHC 34.0 30.0 - 36.0 g/dL   RDW 78.6 76.7 - 20.9 %   Platelets 280 150 - 400 K/uL   nRBC 0.0 0.0 - 0.2 %  Microalbumin, urine  Result Value Ref Range   Microalb, Ur 32.7 (H) Not Estab. ug/mL  Lipid panel  Result Value Ref Range   Cholesterol 205 (H) 0 - 200 mg/dL   Triglycerides 470 <962 mg/dL   HDL 45 >83 mg/dL   Total CHOL/HDL Ratio 4.6 RATIO   VLDL  29 0 - 40 mg/dL   LDL Cholesterol 545 (H) 0 - 99 mg/dL  Comprehensive metabolic panel  Result Value Ref Range   Sodium 134 (L) 135 - 145 mmol/L   Potassium 3.7 3.5 - 5.1 mmol/L   Chloride 101 98 - 111 mmol/L   CO2 23 22 - 32 mmol/L   Glucose, Bld 333 (H) 70 - 99 mg/dL   BUN 14 6 - 20 mg/dL   Creatinine, Ser 6.25 (L) 0.44 - 1.00 mg/dL   Calcium 9.0 8.9 - 63.8 mg/dL   Total Protein 7.6 6.5 - 8.1 g/dL   Albumin 3.7 3.5 - 5.0 g/dL   AST 21 15 - 41 U/L   ALT 20 0 - 44 U/L   Alkaline Phosphatase 129 (H) 38 - 126 U/L   Total Bilirubin 0.9 0.3 - 1.2 mg/dL   GFR, Estimated >93 >73 mL/min   Anion gap 10 5 - 15  Hemoglobin A1c  Result Value Ref Range   Hgb A1c MFr Bld 12.5 (H) 4.8 - 5.6 %   Mean Plasma Glucose 312.05 mg/dL      Assessment & Plan:   l  Increase insulin to 15 u bid  covid test

## 2020-03-15 LAB — SARS-COV-2, NAA 2 DAY TAT

## 2020-03-15 LAB — NOVEL CORONAVIRUS, NAA: SARS-CoV-2, NAA: NOT DETECTED

## 2020-03-15 NOTE — Progress Notes (Signed)
There were no vitals taken for this visit.   Subjective:    Patient ID: Alice Perez, female    DOB: 1965/08/26, 55 y.o.   MRN: [id number]  HPI: Angele Wiemann is a 55 y.o. female presenting on 03/14/2020 for No chief complaint on file.   HPI   Pt was scheduled for in-person appointment but was changed to virtual appointment due to her symptoms.  This is a telemedicine appointment through [employer] due to coronavirus pandemic.  I connected with  Alice Perez on 03/15/20 by a video enabled telemedicine application and verified that I am speaking with the correct person using two identifiers.   I discussed the limitations of evaluation and management by telemedicine. The patient expressed understanding and agreed to proceed.  Pt is in her parked car.  Provider and translator are in the office.    pt is having chills and body aches and congestion.  She hasn't checked her temperature.  She has diarrhea also.  He started feeling bad yesterday.   She got her first 2 doses covid vaccination but hasn't yet gotten the booster. No known exposures to covid She does not work Her granddaughter is sick and her daughter had exposure at work.  She lives with the both of them.   She was doing well before yesterday.    Her bs at home have been running high.  307 is highest.  Lower in the afternoon.  Lowest 170    Relevant past medical, surgical, family and social history reviewed and updated as indicated. Interim medical history since our last visit reviewed. Allergies and medications reviewed and updated.   Current Outpatient Medications:  .  insulin NPH-regular Human (NOVOLIN 70/30) (70-30) 100 UNIT/ML injection, Inject 10 units subcutaneously twice daily before meals.  inyecte 10 unidades subcutaneos dos veces diarios antes de comer, Disp: 10 mL, Rfl: 1 .  lisinopril (ZESTRIL) 10 MG tablet, Take 1 tablet (10 mg total) by mouth daily., Disp: 30 tablet, Rfl: 1 .  metFORMIN (GLUCOPHAGE) 1000 MG  tablet, Take 1 tablet (1,000 mg total) by mouth 2 (two) times daily with a meal., Disp: 60 tablet, Rfl: 1 .  Omega-3 Fatty Acids (FISH OIL PO), Take 1 capsule by mouth 2 (two) times daily., Disp: , Rfl:     Review of Systems  Per HPI unless specifically indicated above     Objective:    There were no vitals taken for this visit.  Wt Readings from Last 3 Encounters:  02/15/20 126 lb 8 oz (57.4 kg)  04/24/16 149 lb 8 oz (67.8 kg)  01/25/16 148 lb 8 oz (67.4 kg)    Physical Exam Constitutional:      General: She is not in acute distress.    Appearance: She is not toxic-appearing.  HENT:     Head: Normocephalic and atraumatic.  Pulmonary:     Effort: Pulmonary effort is normal. No respiratory distress.  Neurological:     Mental Status: She is alert and oriented to person, place, and time.  Psychiatric:        Attention and Perception: Attention normal.        Speech: Speech normal.        Behavior: Behavior is cooperative.     Results for orders placed or performed during the hospital encounter of 02/15/20  CBC  Result Value Ref Range   WBC 6.7 4.0 - 10.5 K/uL   RBC 4.69 3.87 - 5.11 MIL/uL   Hemoglobin 13.6 12.0 - 15.0 g/dL  HCT 40.0 36.0 - 46.0 %   MCV 85.3 80.0 - 100.0 fL   MCH 29.0 26.0 - 34.0 pg   MCHC 34.0 30.0 - 36.0 g/dL   RDW 97.0 26.3 - 78.5 %   Platelets 280 150 - 400 K/uL   nRBC 0.0 0.0 - 0.2 %  Microalbumin, urine  Result Value Ref Range   Microalb, Ur 32.7 (H) Not Estab. ug/mL  Lipid panel  Result Value Ref Range   Cholesterol 205 (H) 0 - 200 mg/dL   Triglycerides 885 <027 mg/dL   HDL 45 >74 mg/dL   Total CHOL/HDL Ratio 4.6 RATIO   VLDL 29 0 - 40 mg/dL   LDL Cholesterol 128 (H) 0 - 99 mg/dL  Comprehensive metabolic panel  Result Value Ref Range   Sodium 134 (L) 135 - 145 mmol/L   Potassium 3.7 3.5 - 5.1 mmol/L   Chloride 101 98 - 111 mmol/L   CO2 23 22 - 32 mmol/L   Glucose, Bld 333 (H) 70 - 99 mg/dL   BUN 14 6 - 20 mg/dL   Creatinine, Ser  7.86 (L) 0.44 - 1.00 mg/dL   Calcium 9.0 8.9 - 76.7 mg/dL   Total Protein 7.6 6.5 - 8.1 g/dL   Albumin 3.7 3.5 - 5.0 g/dL   AST 21 15 - 41 U/L   ALT 20 0 - 44 U/L   Alkaline Phosphatase 129 (H) 38 - 126 U/L   Total Bilirubin 0.9 0.3 - 1.2 mg/dL   GFR, Estimated >20 >94 mL/min   Anion gap 10 5 - 15  Hemoglobin A1c  Result Value Ref Range   Hgb A1c MFr Bld 12.5 (H) 4.8 - 5.6 %   Mean Plasma Glucose 312.05 mg/dL      Assessment & Plan:    Encounter Diagnoses  Name Primary?  . Person under investigation for COVID-19 Yes  . Chills   . Body aches   . Nasal congestion   . Diarrhea, unspecified type   . Uncontrolled type 2 diabetes mellitus with complication (HCC)   . Primary hypertension   . Hyperlipidemia, unspecified hyperlipidemia type   . Not proficient in [place] language      -reviewed labs with pt -pt to increase lantus to 15 units -she is scheduled for covid testing.  She is counseled on self-isolation and self-treatment -she will need statin as well -she is scheduled to RTO for pap and review DM in 6 weeks.  She is to contact office sooner prn

## 2020-03-16 ENCOUNTER — Telehealth: Payer: Self-pay | Admitting: Student

## 2020-03-16 NOTE — Telephone Encounter (Signed)
LPN called and notified pt of NEGATIVE covid test. Pt verbalized understanding.

## 2020-03-20 ENCOUNTER — Ambulatory Visit (HOSPITAL_COMMUNITY): Admission: RE | Admit: 2020-03-20 | Payer: Self-pay | Source: Ambulatory Visit

## 2020-03-27 ENCOUNTER — Other Ambulatory Visit: Payer: Self-pay

## 2020-03-27 ENCOUNTER — Ambulatory Visit (HOSPITAL_COMMUNITY)
Admission: RE | Admit: 2020-03-27 | Discharge: 2020-03-27 | Disposition: A | Discharge status: Home / Self Care | Payer: Self-pay | Source: Ambulatory Visit | Attending: Physician Assistant | Admitting: Physician Assistant

## 2020-03-27 DIAGNOSIS — Z1239 Encounter for other screening for malignant neoplasm of breast: Secondary | ICD-10-CM | POA: Insufficient documentation

## 2020-04-25 ENCOUNTER — Other Ambulatory Visit (HOSPITAL_COMMUNITY)
Admission: RE | Admit: 2020-04-25 | Discharge: 2020-04-25 | Disposition: A | Discharge status: Home / Self Care | Payer: Self-pay | Source: Ambulatory Visit | Attending: Physician Assistant | Admitting: Physician Assistant

## 2020-04-25 ENCOUNTER — Other Ambulatory Visit: Payer: Self-pay

## 2020-04-25 ENCOUNTER — Encounter: Payer: Self-pay | Admitting: Physician Assistant

## 2020-04-25 ENCOUNTER — Ambulatory Visit: Payer: Self-pay | Admitting: Physician Assistant

## 2020-04-25 VITALS — BP 130/75 | HR 83 | Temp 97.1°F | Wt 139.0 lb

## 2020-04-25 DIAGNOSIS — E1165 Type 2 diabetes mellitus with hyperglycemia: Secondary | ICD-10-CM

## 2020-04-25 DIAGNOSIS — IMO0002 Reserved for concepts with insufficient information to code with codable children: Secondary | ICD-10-CM

## 2020-04-25 DIAGNOSIS — Z124 Encounter for screening for malignant neoplasm of cervix: Secondary | ICD-10-CM

## 2020-04-25 DIAGNOSIS — E785 Hyperlipidemia, unspecified: Secondary | ICD-10-CM

## 2020-04-25 DIAGNOSIS — Z789 Other specified health status: Secondary | ICD-10-CM

## 2020-04-25 MED ORDER — LISINOPRIL 10 MG PO TABS: 10.0000 mg | ORAL_TABLET | Freq: Every day | ORAL | 3 refills | Status: DC

## 2020-04-25 MED ORDER — METFORMIN HCL 1000 MG PO TABS: 1000.0000 mg | ORAL_TABLET | Freq: Two times a day (BID) | ORAL | 3 refills | Status: DC

## 2020-04-25 MED ORDER — NOVOLIN 70/30 (70-30) 100 UNIT/ML ~~LOC~~ SUSP: SUBCUTANEOUS | 1 refills | Status: DC

## 2020-04-25 NOTE — Patient Instructions (Signed)
Diabetes mellitus y nutricin, en adultos Diabetes Mellitus and Nutrition, Adult Si sufre de diabetes, o diabetes mellitus, es muy importante tener hbitos alimenticios saludables debido a que sus niveles de azcar en la sangre (glucosa) se ven afectados en gran medida por lo que come y bebe. Comer alimentos saludables en las cantidades correctas, aproximadamente a la misma hora todos los das, lo ayudar a: Controlar la glucemia. Disminuir el riesgo de sufrir una enfermedad cardaca. Mejorar la presin arterial. Alcanzar o mantener un peso saludable. Qu puede afectar mi plan de alimentacin? Todas las personas que sufren de diabetes son diferentes y cada una tiene necesidades diferentes en cuanto a un plan de alimentacin. El mdico puede recomendarle que trabaje con un nutricionista para elaborar el mejor plan para usted. Su plan de alimentacin puede variar segn factores como: Las caloras que necesita. Los medicamentos que toma. Su peso. Sus niveles de glucemia, presin arterial y colesterol. Su nivel de actividad. Otras afecciones que tenga, como enfermedades cardacas o renales. Cmo me afectan los carbohidratos? Los carbohidratos, o hidratos de carbono, afectan su nivel de glucemia ms que cualquier otro tipo de alimento. La ingesta de carbohidratos naturalmente aumenta la cantidad de glucosa en la sangre. El recuento de carbohidratos es un mtodo destinado a llevar un registro de la cantidad de carbohidratos que se consumen. El recuento de carbohidratos es importante para mantener la glucemia a un nivel saludable, especialmente si utiliza insulina o toma determinados medicamentos por va oral para la diabetes. Es importante conocer la cantidad de carbohidratos que se pueden ingerir en cada comida sin correr ningn riesgo. Esto es diferente en cada persona. Su nutricionista puede ayudarlo a calcular la cantidad de carbohidratos que debe ingerir en cada comida y en cada refrigerio. Cmo  me afecta el alcohol? El alcohol puede provocar disminuciones sbitas de la glucemia (hipoglucemia), especialmente si utiliza insulina o toma determinados medicamentos por va oral para la diabetes. La hipoglucemia es una afeccin potencialmente mortal. Los sntomas de la hipoglucemia, como somnolencia, mareos y confusin, son similares a los sntomas de haber consumido demasiado alcohol. No beba alcohol si: Su mdico le indica no hacerlo. Est embarazada, puede estar embarazada o est tratando de quedar embarazada. Si bebe alcohol: No beba con el estmago vaco. Limite la cantidad que bebe: De 0 a 1 medida por da para las mujeres. De 0 a 2 medidas por da para los hombres. Est atento a la cantidad de alcohol que hay en las bebidas que toma. En los [place], una medida equivale a una botella de cerveza de 12 oz (355 ml), un vaso de vino de 5 oz (148 ml) o un vaso de una bebida alcohlica de alta graduacin de 1 oz (44 ml). Mantngase hidratado bebiendo agua, refrescos dietticos o t helado sin azcar. Tenga en cuenta que los refrescos comunes, los jugos y otras bebida para mezclar pueden contener mucha azcar y se deben contar como carbohidratos. Consejos para seguir este plan Leer las etiquetas de los alimentos Comience por leer el tamao de la porcin en la "Informacin nutricional" en las etiquetas de los alimentos envasados y las bebidas. La cantidad de caloras, carbohidratos, grasas y otros nutrientes mencionados en la etiqueta se basan en una porcin del alimento. Muchos alimentos contienen ms de una porcin por envase. Verifique la cantidad total de gramos (g) de carbohidratos totales en una porcin. Puede calcular la cantidad de porciones de carbohidratos al dividir el total de carbohidratos por 15. Por ejemplo, si un alimento tiene un   total de 30 g de carbohidratos totales por porcin, equivale a 2 porciones de carbohidratos. Verifique la cantidad de gramos (g) de grasas  saturadas y grasas trans de una porcin. Escoja alimentos que no contengan estas grasas o que su contenido de estas sea bajo. Verifique la cantidad de miligramos (mg) de sal (sodio) en una porcin. La mayora de las personas deben limitar la ingesta de sodio total a menos de 2300 mg por da. Siempre consulte la informacin nutricional de los alimentos etiquetados como "con bajo contenido de grasa" o "sin grasa". Estos alimentos pueden tener un mayor contenido de azcar agregada o carbohidratos refinados, y deben evitarse. Hable con su nutricionista para identificar sus objetivos diarios en cuanto a los nutrientes mencionados en la etiqueta. Al ir de compras Evite comprar alimentos procesados, enlatados o precocidos. Estos alimentos tienden a tener una mayor cantidad de grasa, sodio y azcar agregada. Compre en la zona exterior de la tienda de comestibles. Esta es la zona donde se encuentran con mayor frecuencia las frutas y las verduras frescas, los cereales a granel, las carnes frescas y los productos lcteos frescos. Al cocinar Utilice mtodos de coccin a baja temperatura, como hornear, en lugar de mtodos de coccin a alta temperatura, como frer en abundante aceite. Cocine con aceites saludables, como el aceite de oliva, canola o girasol. Evite cocinar con manteca, crema o carnes con alto contenido de grasa. Planificacin de las comidas Coma las comidas y los refrigerios regularmente, preferentemente a la misma hora todos los das. Evite pasar largos perodos de tiempo sin comer. Consuma alimentos ricos en fibra, como frutas frescas, verduras, frijoles y cereales integrales. Consulte a su nutricionista sobre cuntas porciones de carbohidratos puede consumir en cada comida. Consuma entre 4 y 6 onzas (entre 112 y 168 g) de protenas magras por da, como carnes magras, pollo, pescado, huevos o tofu. Una onza (oz) de protena magra equivale a: 1 onza (28 g) de carne, pollo o pescado. 1 huevo.  de  taza (62 g) de tofu. Coma algunos alimentos por da que contengan grasas saludables, como aguacates, frutos secos, semillas y pescado. Qu alimentos debo comer? Frutas Bayas. Manzanas. Naranjas. Duraznos. Damascos. Ciruelas. Uvas. Mango. Papaya. Granada. Kiwi. Cerezas. Verduras Lechuga. Espinaca. Verduras de hoja verde, que incluyen col rizada, acelga, hojas de berza y de mostaza. Remolachas. Coliflor. Repollo. Brcoli. Zanahorias. Judas verdes. Tomates. Pimientos. Cebollas. Pepinos. Coles de Bruselas. Granos Granos integrales, como panes, galletas, tortillas, cereales y pastas de salvado o integrales. Avena sin azcar. Quinua. Arroz integral o salvaje. Carnes y otras protenas Mariscos. Carne de ave sin piel. Cortes magros de ave y carne de res. Tofu. Frutos secos. Semillas. Lcteos Productos lcteos sin grasa o con bajo contenido de grasa, como leche, yogur y queso. Es posible que los productos que se enumeran ms arriba no constituyan una lista completa de los alimentos y las bebidas que puede tomar. Consulte a un nutricionista para obtener ms informacin. Qu alimentos debo evitar? Frutas Frutas enlatadas al almbar. Verduras Verduras enlatadas. Verduras congeladas con mantequilla o salsa de crema. Granos Productos elaborados con harina y harina blanca refinada, como panes, pastas, bocadillos y cereales. Evite todos los alimentos procesados. Carnes y otras protenas Cortes de carne con alto contenido de grasa. Carne de ave con piel. Carnes empanizadas o fritas. Carne procesada. Evite las grasas saturadas. Lcteos Yogur, queso o leche enteros. Bebidas Bebidas azucaradas, como gaseosas o t helado. Es posible que los productos que se enumeran ms arriba no constituyan una lista completa de   los alimentos y las bebidas que debe evitar. Consulte a un nutricionista para obtener ms informacin. Preguntas para hacerle al mdico Es necesario que me rena con un instructor en el cuidado  de la diabetes? Es necesario que me rena con un nutricionista? A qu nmero puedo llamar si tengo preguntas? Cules son los mejores momentos para controlar la glucemia? Dnde encontrar ms informacin: Asociacin Estadounidense de la Diabetes (American Diabetes Association): diabetes.org Academy of Nutrition and Dietetics (Academia de Nutricin y Diettica): www.eatright.org National Institute of Diabetes and Digestive and Kidney Diseases (Instituto Nacional de la Diabetes y las Enfermedades Digestivas y Renales): www.niddk.nih.gov Association of Diabetes Care and Education Specialists (Asociacin de Especialistas en Atencin y Educacin sobre la Diabetes): www.diabeteseducator.org Resumen Es importante tener hbitos alimenticios saludables debido a que sus niveles de azcar en la sangre (glucosa) se ven afectados en gran medida por lo que come y bebe. Un plan de alimentacin saludable lo ayudar a controlar la glucemia y mantener un estilo de vida saludable. El mdico puede recomendarle que trabaje con un nutricionista para elaborar el mejor plan para usted. Tenga en cuenta que los carbohidratos (hidratos de carbono) y el alcohol tienen efectos inmediatos en sus niveles de glucemia. Es importante contar los carbohidratos que ingiere y consumir alcohol con prudencia. Esta informacin no tiene como fin reemplazar el consejo del mdico. Asegrese de hacerle al mdico cualquier pregunta que tenga. Document Revised: 03/11/2019 Document Reviewed: 03/11/2019 Elsevier Patient Education  2021 Elsevier Inc.  

## 2020-04-25 NOTE — Progress Notes (Signed)
BP 130/75   Pulse 83   Temp (!) 97.1 F (36.2 C)   Wt 139 lb (63 kg)   SpO2 99%   BMI 28.56 kg/m    Subjective:    Patient ID: Alice Perez, female    DOB: [birth year], 55 y.o.   MRN: [id number]  HPI: Alice Perez is a 55 y.o. female presenting on 04/25/2020 for Gynecologic Exam   HPI    Pt had a negative covid 19 screening questionnaire.     Pt is 54yoF who presents to follow up DM and update PAP.    She isn't really following a diabetic diet.  She has been monitoring her bs and has her log sheet.  Morning readings are from 126- 208 with one reading of 328.  Evening readings are 116-284.    LMP age 70     Relevant past medical, surgical, family and social history reviewed and updated as indicated. Interim medical history since our last visit reviewed. Allergies and medications reviewed and updated.   Current Outpatient Medications:  .  insulin NPH-regular Human (NOVOLIN 70/30) (70-30) 100 UNIT/ML injection, Inject 10 units subcutaneously twice daily before meals.  inyecte 10 unidades subcutaneos dos veces diarios antes de comer (Patient taking differently: 15 Units 2 (two) times daily with a meal. Inject 15 units subcutaneously twice daily before meals.  inyecte 15 unidades subcutaneos dos veces diarios antes de comer), Disp: 10 mL, Rfl: 1 .  lisinopril (ZESTRIL) 10 MG tablet, Take 1 tablet (10 mg total) by mouth daily., Disp: 30 tablet, Rfl: 1 .  metFORMIN (GLUCOPHAGE) 1000 MG tablet, Take 1 tablet (1,000 mg total) by mouth 2 (two) times daily with a meal., Disp: 60 tablet, Rfl: 1 .  Omega-3 Fatty Acids (FISH OIL PO), Take 1 capsule by mouth 2 (two) times daily., Disp: , Rfl:     Review of Systems  Per HPI unless specifically indicated above     Objective:    BP 130/75   Pulse 83   Temp (!) 97.1 F (36.2 C)   Wt 139 lb (63 kg)   SpO2 99%   BMI 28.56 kg/m   Wt Readings from Last 3 Encounters:  04/25/20 139 lb (63 kg)  02/15/20 126 lb 8 oz (57.4 kg)   04/24/16 149 lb 8 oz (67.8 kg)    Physical Exam Vitals and nursing note reviewed.  Constitutional:      General: She is not in acute distress.    Appearance: She is well-developed and well-nourished. She is not ill-appearing.  HENT:     Head: Normocephalic and atraumatic.  Pulmonary:     Effort: Pulmonary effort is normal.     Comments: Breast exam normal Chest:  Breasts: No discharge from either breast. No tenderness and bleeding.    Abdominal:     Palpations: Abdomen is soft. There is no mass.     Tenderness: There is no abdominal tenderness. There is no guarding or rebound.  Genitourinary:    Labia:        Right: No rash, tenderness or lesion.        Left: No rash, tenderness or lesion.      Vagina: Normal.     Cervix: No cervical motion tenderness, discharge or friability.     Uterus: Normal.      Adnexa:        Right: No mass, tenderness or fullness.         Left: No mass, tenderness or fullness.  Comments: Biochemist, clinical assisted) Musculoskeletal:     Right lower leg: No edema.     Left lower leg: No edema.  Skin:    General: Skin is warm and dry.  Neurological:     Mental Status: She is alert and oriented to person, place, and time.  Psychiatric:        Attention and Perception: Attention normal.        Mood and Affect: Mood and affect normal.        Speech: Speech normal.        Behavior: Behavior normal. Behavior is cooperative.            Assessment & Plan:    Encounter Diagnoses  Name Primary?  . Routine Papanicolaou smear Yes  . Uncontrolled type 2 diabetes mellitus with complication (HCC)   . Not proficient in English language      -pt to Increase insulin to 25u bid.  She is to monitor bs.  She is to contact office for fbs < 70 or > 300 -Mammogram UTD -pt to follow up with bs log 1 month.  She is to contact office sooner prn

## 2020-04-27 LAB — CYTOLOGY - PAP
Comment: NEGATIVE
Diagnosis: NEGATIVE
Diagnosis: REACTIVE
High risk HPV: NEGATIVE

## 2020-05-10 ENCOUNTER — Other Ambulatory Visit: Payer: Self-pay | Admitting: Physician Assistant

## 2020-05-16 ENCOUNTER — Other Ambulatory Visit: Payer: Self-pay | Admitting: Physician Assistant

## 2020-05-16 MED ORDER — NOVOLIN 70/30 RELION (70-30) 100 UNIT/ML ~~LOC~~ SUSP: SUBCUTANEOUS | 6 refills | Status: DC

## 2020-05-29 ENCOUNTER — Other Ambulatory Visit: Payer: Self-pay

## 2020-05-29 ENCOUNTER — Other Ambulatory Visit (HOSPITAL_COMMUNITY)
Admission: RE | Admit: 2020-05-29 | Discharge: 2020-05-29 | Disposition: A | Discharge status: Home / Self Care | Payer: Self-pay | Source: Ambulatory Visit | Attending: Physician Assistant | Admitting: Physician Assistant

## 2020-05-29 DIAGNOSIS — IMO0002 Reserved for concepts with insufficient information to code with codable children: Secondary | ICD-10-CM

## 2020-05-29 DIAGNOSIS — E1165 Type 2 diabetes mellitus with hyperglycemia: Secondary | ICD-10-CM | POA: Insufficient documentation

## 2020-05-29 DIAGNOSIS — E785 Hyperlipidemia, unspecified: Secondary | ICD-10-CM | POA: Insufficient documentation

## 2020-05-29 DIAGNOSIS — E118 Type 2 diabetes mellitus with unspecified complications: Secondary | ICD-10-CM | POA: Insufficient documentation

## 2020-05-29 LAB — COMPREHENSIVE METABOLIC PANEL
ALT: 23 U/L (ref 0–44)
AST: 26 U/L (ref 15–41)
Albumin: 3.6 g/dL (ref 3.5–5.0)
Alkaline Phosphatase: 133 U/L — ABNORMAL HIGH (ref 38–126)
Anion gap: 11 (ref 5–15)
BUN: 16 mg/dL (ref 6–20)
CO2: 24 mmol/L (ref 22–32)
Calcium: 9 mg/dL (ref 8.9–10.3)
Chloride: 100 mmol/L (ref 98–111)
Creatinine, Ser: 0.47 mg/dL (ref 0.44–1.00)
GFR, Estimated: >60 mL/min (ref >60)
Glucose, Bld: 216 mg/dL — ABNORMAL HIGH (ref 70–99)
Potassium: 4 mmol/L (ref 3.5–5.1)
Sodium: 135 mmol/L (ref 135–145)
Total Bilirubin: 0.6 mg/dL (ref 0.3–1.2)
Total Protein: 7.6 g/dL (ref 6.5–8.1)

## 2020-05-29 LAB — LIPID PANEL
Cholesterol: 158 mg/dL (ref 0–200)
HDL: 47 mg/dL (ref >40)
LDL Cholesterol: 95 mg/dL (ref 0–99)
Total CHOL/HDL Ratio: 3.4 RATIO
Triglycerides: 79 mg/dL (ref <150)
VLDL: 16 mg/dL (ref 0–40)

## 2020-05-29 LAB — HEMOGLOBIN A1C
Hgb A1c MFr Bld: 9.9 % — ABNORMAL HIGH (ref 4.8–5.6)
Mean Plasma Glucose: 237.43 mg/dL

## 2020-05-30 ENCOUNTER — Ambulatory Visit: Payer: Self-pay | Admitting: Physician Assistant

## 2020-05-30 ENCOUNTER — Encounter: Payer: Self-pay | Admitting: Physician Assistant

## 2020-05-30 ENCOUNTER — Other Ambulatory Visit: Payer: Self-pay | Admitting: Physician Assistant

## 2020-05-30 VITALS — BP 132/68 | HR 85 | Temp 96.6°F | Wt 135.0 lb

## 2020-05-30 DIAGNOSIS — E1165 Type 2 diabetes mellitus with hyperglycemia: Secondary | ICD-10-CM

## 2020-05-30 DIAGNOSIS — Z789 Other specified health status: Secondary | ICD-10-CM

## 2020-05-30 DIAGNOSIS — IMO0002 Reserved for concepts with insufficient information to code with codable children: Secondary | ICD-10-CM

## 2020-05-30 DIAGNOSIS — R1011 Right upper quadrant pain: Secondary | ICD-10-CM

## 2020-05-30 DIAGNOSIS — E785 Hyperlipidemia, unspecified: Secondary | ICD-10-CM

## 2020-05-30 DIAGNOSIS — R059 Cough, unspecified: Secondary | ICD-10-CM

## 2020-05-30 DIAGNOSIS — I10 Essential (primary) hypertension: Secondary | ICD-10-CM

## 2020-05-30 MED ORDER — OMEPRAZOLE 40 MG PO CPDR: DELAYED_RELEASE_CAPSULE | ORAL | 3 refills | Status: DC

## 2020-05-30 MED ORDER — OMEPRAZOLE 40 MG PO CPDR: DELAYED_RELEASE_CAPSULE | ORAL | 0 refills | Status: DC

## 2020-05-30 MED ORDER — LOSARTAN POTASSIUM 50 MG PO TABS: 50.0000 mg | ORAL_TABLET | Freq: Every day | ORAL | 1 refills | Status: DC

## 2020-05-30 MED ORDER — ATORVASTATIN CALCIUM 10 MG PO TABS: 10.0000 mg | ORAL_TABLET | Freq: Every day | ORAL | 1 refills | Status: DC

## 2020-05-30 MED ORDER — METFORMIN HCL 1000 MG PO TABS: 1000.0000 mg | ORAL_TABLET | Freq: Two times a day (BID) | ORAL | 1 refills | Status: DC

## 2020-05-30 NOTE — Progress Notes (Signed)
BP 132/68   Pulse 85   Temp (!) 96.6 F (35.9 C)   Wt 135 lb (61.2 kg)   SpO2 99%   BMI 27.73 kg/m    Subjective:    Patient ID: Alice Perez, female    DOB: 1965/04/28, 55 y.o.   MRN: [id number]  HPI: Alice Perez is a 55 y.o. female presenting on 05/30/2020 for Diabetes and Hyperlipidemia   HPI  Pt had a negative covid 19 screening questionnaire.   Chief Complaint  Patient presents with  . Diabetes  . Hyperlipidemia      She says she is watching what she eats.  She has bs log.  only 2 readings are > 200,  Some are in the 90s.  Readings on bs log are Not consistent with a1c of 9.9  She doesn't work  She is having RUQ abd pain.  It comes and goes.  It hurts when she is startingt to eat.   She hasn't eaten today and doesn't feel pain.    She had [place] in 2003 that showed cholelithiasis and fatty liver.  She is s/p gallbladder removed.   She is using aspirin for the pain.    She is observed to be coughing.  She says She has had it since January.  It is not as bad but it just never comepletely went away  .   Relevant past medical, surgical, family and social history reviewed and updated as indicated. Interim medical history since our last visit reviewed. Allergies and medications reviewed and updated.   Current Outpatient Medications:  .  aspirin 325 MG tablet, Take 325 mg by mouth daily., Disp: , Rfl:  .  insulin NPH-regular Human (NOVOLIN 70/30 RELION) (70-30) 100 UNIT/ML injection, Inject 25 units SQ before breakfast and before evening meal.   Injecte 25 unidades subcutaneamente dos veces al dia con el alimento y antes de el alimento de la tarde, Disp: 20 mL, Rfl: 6 .  lisinopril (ZESTRIL) 10 MG tablet, Take 1 tablet (10 mg total) by mouth daily. Tome una tableta por boca diaria, Disp: 30 tablet, Rfl: 3 .  metFORMIN (GLUCOPHAGE) 1000 MG tablet, Take 1 tablet (1,000 mg total) by mouth 2 (two) times daily with a meal. Tome una tableta por boca dos veces diarias con comida,  Disp: 60 tablet, Rfl: 3 .  Omega-3 Fatty Acids (FISH OIL PO), Take 1 capsule by mouth 2 (two) times daily., Disp: , Rfl:     Review of Systems  Per HPI unless specifically indicated above     Objective:    BP 132/68   Pulse 85   Temp (!) 96.6 F (35.9 C)   Wt 135 lb (61.2 kg)   SpO2 99%   BMI 27.73 kg/m   Wt Readings from Last 3 Encounters:  05/30/20 135 lb (61.2 kg)  04/25/20 139 lb (63 kg)  02/15/20 126 lb 8 oz (57.4 kg)    Physical Exam Vitals reviewed.  Constitutional:      General: She is not in acute distress.    Appearance: She is well-developed. She is not ill-appearing.  HENT:     Head: Normocephalic and atraumatic.  Cardiovascular:     Rate and Rhythm: Normal rate and regular rhythm.  Pulmonary:     Effort: Pulmonary effort is normal.     Breath sounds: Normal breath sounds.  Abdominal:     General: Bowel sounds are normal.     Palpations: Abdomen is soft. There is no hepatomegaly, splenomegaly  or mass.     Tenderness: There is abdominal tenderness in the right upper quadrant and epigastric area. There is no guarding or rebound.  Musculoskeletal:     Cervical back: Neck supple.     Right lower leg: No edema.     Left lower leg: No edema.  Lymphadenopathy:     Cervical: No cervical adenopathy.  Skin:    General: Skin is warm and dry.  Neurological:     Mental Status: She is alert and oriented to person, place, and time.  Psychiatric:        Behavior: Behavior normal.     Results for orders placed or performed during the hospital encounter of 05/29/20  Lipid panel  Result Value Ref Range   Cholesterol 158 0 - 200 mg/dL   Triglycerides 79 <037 mg/dL   HDL 47 >04 mg/dL   Total CHOL/HDL Ratio 3.4 RATIO   VLDL 16 0 - 40 mg/dL   LDL Cholesterol 95 0 - 99 mg/dL  Comprehensive metabolic panel  Result Value Ref Range   Sodium 135 135 - 145 mmol/L   Potassium 4.0 3.5 - 5.1 mmol/L   Chloride 100 98 - 111 mmol/L   CO2 24 22 - 32 mmol/L   Glucose,  Bld 216 (H) 70 - 99 mg/dL   BUN 16 6 - 20 mg/dL   Creatinine, Ser 8.88 0.44 - 1.00 mg/dL   Calcium 9.0 8.9 - 91.6 mg/dL   Total Protein 7.6 6.5 - 8.1 g/dL   Albumin 3.6 3.5 - 5.0 g/dL   AST 26 15 - 41 U/L   ALT 23 0 - 44 U/L   Alkaline Phosphatase 133 (H) 38 - 126 U/L   Total Bilirubin 0.6 0.3 - 1.2 mg/dL   GFR, Estimated >94 >50 mL/min   Anion gap 11 5 - 15  Hemoglobin A1c  Result Value Ref Range   Hgb A1c MFr Bld 9.9 (H) 4.8 - 5.6 %   Mean Plasma Glucose 237.43 mg/dL      Assessment & Plan:   Encounter Diagnoses  Name Primary?  . Right upper quadrant abdominal pain Yes  . Hyperlipidemia, unspecified hyperlipidemia type   . Not proficient in [place] language   . Uncontrolled type 2 diabetes mellitus with complication (HCC)   . Cough   . Primary hypertension        -Reviewed labs with pt -Add low dose statin (to [facility]) -Will change lisinopril to losartan to see if cough will resolve -Refer for DM eye exam -Sign up for [facility] -Stop aspirin.  Start omeprazole today -[place] RUQ -pt was given cafa / application for [employer] financial assistance -f/u 1 mo to recheck abdominal pain.  She is to contact office sooner prn

## 2020-06-01 ENCOUNTER — Ambulatory Visit (HOSPITAL_COMMUNITY)
Admission: RE | Admit: 2020-06-01 | Discharge: 2020-06-01 | Disposition: A | Discharge status: Home / Self Care | Payer: Self-pay | Source: Ambulatory Visit | Attending: Physician Assistant | Admitting: Physician Assistant

## 2020-06-01 ENCOUNTER — Telehealth: Payer: Self-pay | Admitting: Physician Assistant

## 2020-06-01 ENCOUNTER — Other Ambulatory Visit: Payer: Self-pay

## 2020-06-01 DIAGNOSIS — R1011 Right upper quadrant pain: Secondary | ICD-10-CM | POA: Insufficient documentation

## 2020-06-01 NOTE — Telephone Encounter (Signed)
Pt was called and was notified that she has an appt on 06/08/2020 with [facility] in [place].

## 2020-06-08 ENCOUNTER — Other Ambulatory Visit: Payer: Self-pay | Admitting: Physician Assistant

## 2020-06-08 DIAGNOSIS — E11319 Type 2 diabetes mellitus with unspecified diabetic retinopathy without macular edema: Secondary | ICD-10-CM

## 2020-06-29 ENCOUNTER — Other Ambulatory Visit: Payer: Self-pay

## 2020-06-29 ENCOUNTER — Ambulatory Visit: Payer: Self-pay | Admitting: Physician Assistant

## 2020-06-29 ENCOUNTER — Encounter: Payer: Self-pay | Admitting: Physician Assistant

## 2020-06-29 VITALS — BP 139/82 | HR 83 | Temp 97.5°F

## 2020-06-29 DIAGNOSIS — Z789 Other specified health status: Secondary | ICD-10-CM

## 2020-06-29 DIAGNOSIS — I10 Essential (primary) hypertension: Secondary | ICD-10-CM

## 2020-06-29 DIAGNOSIS — R1011 Right upper quadrant pain: Secondary | ICD-10-CM

## 2020-06-29 DIAGNOSIS — E785 Hyperlipidemia, unspecified: Secondary | ICD-10-CM

## 2020-06-29 DIAGNOSIS — E1165 Type 2 diabetes mellitus with hyperglycemia: Secondary | ICD-10-CM

## 2020-06-29 DIAGNOSIS — IMO0002 Reserved for concepts with insufficient information to code with codable children: Secondary | ICD-10-CM

## 2020-06-29 DIAGNOSIS — R059 Cough, unspecified: Secondary | ICD-10-CM

## 2020-06-29 DIAGNOSIS — K760 Fatty (change of) liver, not elsewhere classified: Secondary | ICD-10-CM

## 2020-06-29 DIAGNOSIS — E11319 Type 2 diabetes mellitus with unspecified diabetic retinopathy without macular edema: Secondary | ICD-10-CM

## 2020-06-29 MED ORDER — ATORVASTATIN CALCIUM 10 MG PO TABS
10.0000 mg | ORAL_TABLET | Freq: Every day | ORAL | 1 refills | Status: DC
Start: 2020-06-29 — End: 2020-09-06

## 2020-06-29 MED ORDER — LOSARTAN POTASSIUM 50 MG PO TABS: 50.0000 mg | ORAL_TABLET | Freq: Every day | ORAL | 1 refills | Status: DC

## 2020-06-29 NOTE — Progress Notes (Signed)
BP 139/82   Pulse 83   Temp (!) 97.5 F (36.4 C)   SpO2 97%    Subjective:    Patient ID: Alice Perez, female    DOB: Nov 05, 1965, 55 y.o.   MRN: [id number]  HPI: Alice Perez is a 55 y.o. female presenting on 06/29/2020 for No chief complaint on file.   HPI    Pt had a negative covid 19 screening questionnaire.  Pt is 54yoF who is in today for recheck of abdominal pain and to check bp after change of medication a her last appointment.   Pt did not start the losartan given at last appointment due to acei cough.  She says the pharmacy didn't have it or the atorvastatin.  She is still having the coughing.  She has appt for eye dr at [facility].  -   On June 1.   She says she spoke with [facility] about financial assistance [facility].  She says her husband doesn't want to provide the papers.  Encouraged her to talk with him to reconsider.   She says her abdominal pain is improving a little bit.  She says it hurts just some times now.  She says the pain is daily, mostly at night.  When she lays on her right side it makes the pain feel better.    She says the pain is usually after her evening meal so sometimes she skips the meal which makes the pain less but does not prevent the pain.    Relevant past medical, surgical, family and social history reviewed and updated as indicated. Interim medical history since our last visit reviewed. Allergies and medications reviewed and updated.   Current Outpatient Medications:  .  insulin NPH-regular Human (NOVOLIN 70/30 RELION) (70-30) 100 UNIT/ML injection, Inject 25 units SQ before breakfast and before evening meal.   Injecte 25 unidades subcutaneamente dos veces al dia con el alimento y antes de el alimento de la tarde, Disp: 20 mL, Rfl: 6 .  lisinopril (ZESTRIL) 10 MG tablet, Take 1 tablet (10 mg total) by mouth daily. Tome una tableta por boca diaria, Disp: 30 tablet, Rfl: 3 .  metFORMIN (GLUCOPHAGE) 1000 MG tablet, Take 1 tablet (1,000  mg total) by mouth 2 (two) times daily with a meal. Tome una tableta por boca dos veces diarias con comida, Disp: 180 tablet, Rfl: 1 .  Omega-3 Fatty Acids (FISH OIL PO), Take 1 capsule by mouth 2 (two) times daily., Disp: , Rfl:  .  omeprazole (PRILOSEC) 40 MG capsule, 1 po bid.  Tome una tableta por [employer] veces diarias, Disp: 180 capsule, Rfl: 0 .  atorvastatin (LIPITOR) 10 MG tablet, Take 1 tablet (10 mg total) by mouth daily. Tome una tableta por boca diaria, Disp: 90 tablet, Rfl: 1 .  losartan (COZAAR) 50 MG tablet, Take 1 tablet (50 mg total) by mouth daily. Tome una tableta por boca diaria (Patient not taking: Reported on 06/29/2020), Disp: 90 tablet, Rfl: 1    Review of Systems  Per HPI unless specifically indicated above     Objective:    BP 139/82   Pulse 83   Temp (!) 97.5 F (36.4 C)   SpO2 97%   Wt Readings from Last 3 Encounters:  05/30/20 135 lb (61.2 kg)  04/25/20 139 lb (63 kg)  02/15/20 126 lb 8 oz (57.4 kg)    Physical Exam Vitals reviewed.  Constitutional:      General: She is not  in acute distress.    Appearance: She is well-developed. She is not toxic-appearing.  HENT:     Head: Normocephalic and atraumatic.  Cardiovascular:     Rate and Rhythm: Normal rate and regular rhythm.  Pulmonary:     Effort: Pulmonary effort is normal.     Breath sounds: Normal breath sounds.  Abdominal:     General: Bowel sounds are normal.     Palpations: Abdomen is soft. There is no hepatomegaly, splenomegaly or mass.     Tenderness: There is abdominal tenderness in the right upper quadrant. There is no guarding or rebound.  Musculoskeletal:     Cervical back: Neck supple.     Right lower leg: No edema.     Left lower leg: No edema.  Lymphadenopathy:     Cervical: No cervical adenopathy.  Skin:    General: Skin is warm and dry.  Neurological:     Mental Status: She is alert and oriented to person, place, and time.  Psychiatric:        Behavior: Behavior normal.               Assessment & Plan:    Encounter Diagnoses  Name Primary?  . Right upper quadrant abdominal pain Yes  . Primary hypertension   . Diabetic retinopathy associated with type 2 diabetes mellitus, macular edema presence unspecified, unspecified laterality, unspecified retinopathy severity (HCC)   . Uncontrolled type 2 diabetes mellitus with complication (HCC)   . Hyperlipidemia, unspecified hyperlipidemia type   . Not proficient in [place] language   . Cough   . NAFLD (nonalcoholic fatty liver disease)      HTN Pt to Stop lisinopril and start losartan to help the coughing  dyslipidemia Pt counseled to Get back on atorvastatin and follow lowfat diet  Abdominal pain and NAFLD Discussed Referral to GI.  Pt wants to wait on financials and discuss with her husband.  She is encouraged to Work on [employer] with [employer].  She is given Reading info on NAFLD  Retinal Hemorrhages Pt has appt June 1 at [facility] with specialist   Gave pt another medassist applicaton Pt to follow up here 1 month.  She is to contact office sooner prn

## 2020-06-29 NOTE — Patient Instructions (Signed)
Enfermedad del hgado graso Fatty Liver Disease  El hgado transforma los alimentos en energa, elimina las sustancias txicas de la sangre, fabrica protenas importantes y absorbe las vitaminas necesarias de los alimentos. La enfermedad del hgado graso ocurre cuando se acumula demasiada grasa en las clulas del hgado. La enfermedad del hgado graso tambin se llama esteatosis heptica. En muchos casos, la enfermedad del hgado graso no provoca sntomas ni problemas. Con frecuencia, se diagnostica cuando se realizan estudios por otros motivos. Sin embargo, con el tiempo, el hgado graso puede provocar una inflamacin que posiblemente cause problemas hepticos ms graves, como la fibrosis heptica (cirrosis) o insuficiencia heptica. El hgado graso se asocia con la resistencia a la insulina, el aumento de la grasa corporal, la presin arterial alta (hipertensin) y el colesterol elevado. Estas son caractersticas del sndrome metablico y aumentan el riesgo de accidente cerebrovascular, diabetes y enfermedad cardaca. Cules son las causas? Esta afeccin puede estar causada por componentes del sndrome metablico:  Obesidad.  Resistencia a la insulina.  Colesterol alto. Otras causas:  Consumo excesivo de alcohol.  Nutricin deficiente.  Sndrome de [place].  Embarazo.  Determinados medicamentos.  Txicos.  Algunas infecciones virales. Qu incrementa el riesgo? Es ms probable que tengan esta afeccin las personas que:  Consumen alcohol en exceso.  Tienen sobrepeso.  Tienen diabetes.  Tienen hepatitis.  Tienen un nivel alto de triglicridos.  Estn embarazadas. Cules son los signos o sntomas? Con frecuencia, la enfermedad del hgado graso no provoca sntomas. Si se desarrollan sntomas, estos pueden incluir:  Fatiga y debilidad.  Prdida de peso.  Confusin.  Nuseas, vmitos o dolor abdominal.  Color amarillo en la piel y en la zona blanca de los ojos  (ictericia).  Picazn en la piel. Cmo se diagnostica? Este trastorno puede diagnosticarse mediante:  Un examen fsico y los antecedentes mdicos.  Anlisis de sangre.  Estudios de diagnstico por imgenes, como ecografa, exploracin por tomografa computarizada (TC) o resonancia magntica (RM).  Biopsia de hgado. Se extrae una pequea muestra de tejido del hgado usando una aguja. La muestra se examina en el microscopio. Cmo se trata? Con frecuencia, la enfermedad del hgado graso es causada por otras afecciones. El tratamiento para el hgado graso puede incluir medicamentos y cambios en el estilo de vida para controlar enfermedades como:  Alcoholismo.  Colesterol alto.  Diabetes.  Sobrepeso u obesidad. Siga estas instrucciones en su casa:  No beba alcohol. Si tiene problemas para dejar de beber, consulte al mdico cmo puede dejar de beber de forma segura con la ayuda de medicamentos o un programa con supervisin. Esto es importante para evitar que la afeccin empeore.  Siga una dieta saludable como se lo haya indicado el mdico. Consulte al mdico sobre trabajar con un nutricionista para elaborar un plan de alimentacin.  Haga ejercicio regularmente. Esto puede ayudarlo a bajar de peso, y a controlar el colesterol y la diabetes. Hable con el mdico sobre qu actividades son mejores para usted y elaboren un plan de ejercicios.  Use los medicamentos de venta libre y los recetados solamente como se lo haya indicado el mdico.  Cumpla con todas las visitas de seguimiento. Esto es importante.   Comunquese con un mdico si:  Tiene dificultad para controlar lo siguiente: ? Nivel de azcar en la sangre. Esto es muy importante si tiene diabetes. ? Colesterol. ? El consumo de alcohol. Solicite ayuda de inmediato si:  Siente dolor abdominal.  Tiene ictericia.  Tiene nuseas y vomita.  Vomita sangre o   una sustancia similar al poso del caf.  Las heces son negras,  alquitranadas o sanguinolentas. Resumen  La enfermedad del hgado graso se desarrolla cuando se acumula demasiada grasa en las clulas del hgado.  Con frecuencia, la enfermedad del hgado no causa sntomas ni problemas. Sin embargo, con Physiological scientist, el hgado graso puede provocar una inflamacin que puede causar problemas hepticos ms graves, como fibrosis heptica (cirrosis).  Es ms probable que desarrolle esta afeccin si consume alcohol en exceso, est embarazada, tiene sobrepeso, diabetes, hepatitis o altos niveles de triglicridos o de colesterol.  Comunquese con el mdico si tiene problemas para controlar su azcar en la sangre, el colesterol o el consumo de alcohol. Esta informacin no tiene Marine scientist el consejo del mdico. Asegrese de hacerle al mdico cualquier pregunta que tenga. Document Revised: 12/14/2019 Document Reviewed: 12/14/2019 Elsevier Patient Education  2021 [employer].

## 2020-07-14 ENCOUNTER — Ambulatory Visit (INDEPENDENT_AMBULATORY_CARE_PROVIDER_SITE_OTHER): Payer: Self-pay

## 2020-07-14 ENCOUNTER — Encounter: Payer: Self-pay | Admitting: Emergency Medicine

## 2020-07-14 ENCOUNTER — Ambulatory Visit
Admission: EM | Admit: 2020-07-14 | Discharge: 2020-07-14 | Disposition: A | Discharge status: Home / Self Care | Payer: Self-pay | Attending: Emergency Medicine | Admitting: Emergency Medicine

## 2020-07-14 ENCOUNTER — Other Ambulatory Visit: Payer: Self-pay

## 2020-07-14 DIAGNOSIS — M79641 Pain in right hand: Secondary | ICD-10-CM

## 2020-07-14 DIAGNOSIS — S50811A Abrasion of right forearm, initial encounter: Secondary | ICD-10-CM

## 2020-07-14 DIAGNOSIS — S62609A Fracture of unspecified phalanx of unspecified finger, initial encounter for closed fracture: Secondary | ICD-10-CM

## 2020-07-14 DIAGNOSIS — M79631 Pain in right forearm: Secondary | ICD-10-CM

## 2020-07-14 MED ORDER — TRAMADOL HCL 50 MG PO TABS: 50.0000 mg | ORAL_TABLET | Freq: Every evening | ORAL | 0 refills | Status: DC | PRN

## 2020-07-14 MED ORDER — MUPIROCIN 2 % EX OINT: 1.0000 "application " | TOPICAL_OINTMENT | Freq: Two times a day (BID) | CUTANEOUS | 0 refills | Status: DC

## 2020-07-14 NOTE — Discharge Instructions (Signed)
Concern for fracture of second digit, indeterminate age Alice Perez tape index and middle finger Thumb spica applied over ace wrap Continue conservative management of rest, ice, and elevation Alternate ibuprofen and tylenol as needed for pain Tramadol for severe break-through pain.  DO NOT TAKE WHILE DRIVING Bactroban for cuts/ abrasions to prevent infection Follow up with orthopedist for further evaluation and management Return or go to the ER if you have any new or worsening symptoms (fever, chills, chest pain, redness, swelling, bruising, deformity etc...)

## 2020-07-14 NOTE — ED Notes (Signed)
Pt unable to tolerate thumb spica d/t wound on arm
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
## 2020-08-01 ENCOUNTER — Other Ambulatory Visit: Payer: Self-pay

## 2020-08-01 ENCOUNTER — Encounter: Payer: Self-pay | Admitting: Physician Assistant

## 2020-08-01 ENCOUNTER — Ambulatory Visit: Payer: Self-pay | Admitting: Physician Assistant

## 2020-08-01 DIAGNOSIS — I10 Essential (primary) hypertension: Secondary | ICD-10-CM

## 2020-08-01 DIAGNOSIS — E11319 Type 2 diabetes mellitus with unspecified diabetic retinopathy without macular edema: Secondary | ICD-10-CM

## 2020-08-01 DIAGNOSIS — IMO0002 Reserved for concepts with insufficient information to code with codable children: Secondary | ICD-10-CM

## 2020-08-01 DIAGNOSIS — Z789 Other specified health status: Secondary | ICD-10-CM

## 2020-08-01 MED ORDER — NOVOLIN 70/30 RELION (70-30) 100 UNIT/ML ~~LOC~~ SUSP: SUBCUTANEOUS | 6 refills | Status: DC

## 2020-08-01 NOTE — Progress Notes (Signed)
LMP 03/04/2016 (Approximate)    Subjective:    Patient ID: Alice Perez, female    DOB: [birth year], 55 y.o.   MRN: [id number]  HPI: Alice Perez is a 55 y.o. female presenting on 08/01/2020 for Hypertension, Abdominal Pain, Hyperlipidemia, Diabetes, and Eye Problem (Retinal hemorrhages)   HPI  This is a telemedicine appointment through [employer] due to coronavirus pandemic.  I connected with  Alice Perez on 08/01/20 by a video enabled telemedicine application and verified that I am speaking with the correct person using two identifiers.   I discussed the limitations of evaluation and management by telemedicine. The patient expressed understanding and agreed to proceed.  Pt is in her parked car.  Provider and translator are at office.       Chief Complaint  Patient presents with   Hypertension   Abdominal Pain   Hyperlipidemia   Diabetes   Eye Problem    Retinal hemorrhages      Pt had covid last week so she was seen via telehealth appointment.  Pt had appointment with retinal specialist at [facility].  Pt did not make it to her eye appointment due to having covid  She did not turn in financial assistance information to [facility] yet.  She has started taking the losartan.  Her cough got better (she had ACEI cough)  She is taking her ppi bid.  Her stomach is feeling a little better      Relevant past medical, surgical, family and social history reviewed and updated as indicated. Interim medical history since our last visit reviewed. Allergies and medications reviewed and updated.   Current Outpatient Medications:    atorvastatin (LIPITOR) 10 MG tablet, Take 1 tablet (10 mg total) by mouth daily. Tome una tableta por boca diaria, Disp: 30 tablet, Rfl: 1   losartan (COZAAR) 50 MG tablet, Take 1 tablet (50 mg total) by mouth daily. Tome una tableta por boca diaria, Disp: 30 tablet, Rfl: 1   omeprazole (PRILOSEC) 40 MG capsule, 1 po bid.  Tome una tableta por boca dos  veces diarias, Disp: 180 capsule, Rfl: 0   insulin NPH-regular Human (NOVOLIN 70/30 RELION) (70-30) 100 UNIT/ML injection, Inject 25 units SQ before breakfast and before evening meal.   Injecte 25 unidades subcutaneamente dos veces al dia con el alimento y antes de el alimento de la tarde, Disp: 20 mL, Rfl: 6   metFORMIN (GLUCOPHAGE) 1000 MG tablet, Take 1 tablet (1,000 mg total) by mouth 2 (two) times daily with a meal. Tome una tableta por boca dos veces diarias con comida, Disp: 180 tablet, Rfl: 1   mupirocin ointment (BACTROBAN) 2 %, Apply 1 application topically 2 (two) times daily., Disp: 22 g, Rfl: 0   Omega-3 Fatty Acids (FISH OIL PO), Take 1 capsule by mouth 2 (two) times daily., Disp: , Rfl:    traMADol (ULTRAM) 50 MG tablet, Take 1 tablet (50 mg total) by mouth at bedtime as needed., Disp: 8 tablet, Rfl: 0    Review of Systems  Per HPI unless specifically indicated above     Objective:    LMP 03/04/2016 (Approximate)   Wt Readings from Last 3 Encounters:  05/30/20 135 lb (61.2 kg)  04/25/20 139 lb (63 kg)  02/15/20 126 lb 8 oz (57.4 kg)    Physical Exam Constitutional:      General: She is not in acute distress.    Appearance: She is not toxic-appearing.  HENT:     Head: Normocephalic and atraumatic.  Pulmonary:     Effort: Pulmonary effort is normal. No respiratory distress.  Neurological:     Mental Status: She is alert and oriented to person, place, and time.  Psychiatric:        Behavior: Behavior normal.          Assessment & Plan:    Encounter Diagnoses  Name Primary?   Uncontrolled type 2 diabetes mellitus with complication (HCC) Yes   Primary hypertension    Diabetic retinopathy associated with type 2 diabetes mellitus, macular edema presence unspecified, unspecified laterality, unspecified retinopathy severity (HCC)    Not proficient in [place] language      -pt is encouraged to get her financial information to [facility] so she can get  assistance -she is to continue her losartan for HTN -she is encouraged to reschedule her eye appointment to get evaluation of retinal hemorrhages -pt is encouraged to monitor and control her blood sugars -pt will follow up here in one month.  She is to contact office sooner prn

## 2020-08-16 ENCOUNTER — Other Ambulatory Visit: Payer: Self-pay | Admitting: Physician Assistant

## 2020-08-16 DIAGNOSIS — I10 Essential (primary) hypertension: Secondary | ICD-10-CM

## 2020-08-16 DIAGNOSIS — IMO0002 Reserved for concepts with insufficient information to code with codable children: Secondary | ICD-10-CM

## 2020-08-16 DIAGNOSIS — E785 Hyperlipidemia, unspecified: Secondary | ICD-10-CM

## 2020-08-29 ENCOUNTER — Ambulatory Visit: Payer: Self-pay | Admitting: Physician Assistant

## 2020-09-04 ENCOUNTER — Other Ambulatory Visit: Payer: Self-pay

## 2020-09-04 ENCOUNTER — Other Ambulatory Visit (HOSPITAL_COMMUNITY)
Admission: RE | Admit: 2020-09-04 | Discharge: 2020-09-04 | Disposition: A | Discharge status: Home / Self Care | Payer: Self-pay | Source: Ambulatory Visit | Attending: Physician Assistant | Admitting: Physician Assistant

## 2020-09-04 DIAGNOSIS — E1165 Type 2 diabetes mellitus with hyperglycemia: Secondary | ICD-10-CM | POA: Insufficient documentation

## 2020-09-04 DIAGNOSIS — E118 Type 2 diabetes mellitus with unspecified complications: Secondary | ICD-10-CM | POA: Insufficient documentation

## 2020-09-04 DIAGNOSIS — I10 Essential (primary) hypertension: Secondary | ICD-10-CM | POA: Insufficient documentation

## 2020-09-04 DIAGNOSIS — E785 Hyperlipidemia, unspecified: Secondary | ICD-10-CM | POA: Insufficient documentation

## 2020-09-04 DIAGNOSIS — IMO0002 Reserved for concepts with insufficient information to code with codable children: Secondary | ICD-10-CM

## 2020-09-04 LAB — LIPID PANEL
Cholesterol: 142 mg/dL (ref 0–200)
HDL: 48 mg/dL (ref >40)
LDL Cholesterol: 72 mg/dL (ref 0–99)
Total CHOL/HDL Ratio: 3 RATIO
Triglycerides: 108 mg/dL (ref <150)
VLDL: 22 mg/dL (ref 0–40)

## 2020-09-04 LAB — COMPREHENSIVE METABOLIC PANEL
ALT: 28 U/L (ref 0–44)
AST: 25 U/L (ref 15–41)
Albumin: 3.8 g/dL (ref 3.5–5.0)
Alkaline Phosphatase: 122 U/L (ref 38–126)
Anion gap: 10 (ref 5–15)
BUN: 18 mg/dL (ref 6–20)
CO2: 25 mmol/L (ref 22–32)
Calcium: 9.3 mg/dL (ref 8.9–10.3)
Chloride: 100 mmol/L (ref 98–111)
Creatinine, Ser: 0.52 mg/dL (ref 0.44–1.00)
GFR, Estimated: >60 mL/min (ref >60)
Glucose, Bld: 297 mg/dL — ABNORMAL HIGH (ref 70–99)
Potassium: 4 mmol/L (ref 3.5–5.1)
Sodium: 135 mmol/L (ref 135–145)
Total Bilirubin: 0.8 mg/dL (ref 0.3–1.2)
Total Protein: 7.9 g/dL (ref 6.5–8.1)

## 2020-09-04 LAB — HEMOGLOBIN A1C
Hgb A1c MFr Bld: 11.1 % — ABNORMAL HIGH (ref 4.8–5.6)
Mean Plasma Glucose: 271.87 mg/dL

## 2020-09-05 ENCOUNTER — Encounter: Payer: Self-pay | Admitting: Physician Assistant

## 2020-09-06 ENCOUNTER — Encounter: Payer: Self-pay | Admitting: Physician Assistant

## 2020-09-06 ENCOUNTER — Ambulatory Visit: Payer: Self-pay | Admitting: Physician Assistant

## 2020-09-06 VITALS — BP 123/65 | HR 82 | Temp 98.3°F | Wt 138.0 lb

## 2020-09-06 DIAGNOSIS — E11319 Type 2 diabetes mellitus with unspecified diabetic retinopathy without macular edema: Secondary | ICD-10-CM

## 2020-09-06 DIAGNOSIS — K219 Gastro-esophageal reflux disease without esophagitis: Secondary | ICD-10-CM

## 2020-09-06 DIAGNOSIS — E785 Hyperlipidemia, unspecified: Secondary | ICD-10-CM

## 2020-09-06 DIAGNOSIS — I10 Essential (primary) hypertension: Secondary | ICD-10-CM

## 2020-09-06 DIAGNOSIS — E1165 Type 2 diabetes mellitus with hyperglycemia: Secondary | ICD-10-CM

## 2020-09-06 DIAGNOSIS — Z789 Other specified health status: Secondary | ICD-10-CM

## 2020-09-06 DIAGNOSIS — IMO0002 Reserved for concepts with insufficient information to code with codable children: Secondary | ICD-10-CM

## 2020-09-06 MED ORDER — ATORVASTATIN CALCIUM 10 MG PO TABS: 10.0000 mg | ORAL_TABLET | Freq: Every day | ORAL | 4 refills | Status: DC

## 2020-09-06 MED ORDER — METFORMIN HCL 1000 MG PO TABS: 1000.0000 mg | ORAL_TABLET | Freq: Two times a day (BID) | ORAL | 4 refills | Status: DC

## 2020-09-06 MED ORDER — LOSARTAN POTASSIUM 50 MG PO TABS: 50.0000 mg | ORAL_TABLET | Freq: Every day | ORAL | 4 refills | Status: DC

## 2020-09-06 MED ORDER — NOVOLIN 70/30 RELION (70-30) 100 UNIT/ML ~~LOC~~ SUSP: SUBCUTANEOUS | 6 refills | Status: DC

## 2020-09-06 NOTE — Progress Notes (Signed)
BP 123/65   Pulse 82   Temp 98.3 F (36.8 C)   Wt 138 lb (62.6 kg)   LMP 03/04/2016 (Approximate)   SpO2 94%   BMI 28.35 kg/m    Subjective:    Patient ID: Alice Perez, female    DOB: 1965-03-05, 55 y.o.   MRN: [id number]  HPI: Alice Perez is a 55 y.o. female presenting on 09/06/2020 for Abdominal Pain, Diabetes, and Hypertension   HPI  Pt had a negative covid 19 screening questionaire.   Chief Complaint  Patient presents with   Abdominal Pain   Diabetes   Hypertension      She says she is all better from having covid last month  She has No more acei cough and bp is good  She is using 25u insulin bid.  She checks her bs- it was 283 this morning.  She checks it every morning.    She has had it go low- to 54- she had skipped a meal.    She is using the omeprazole bid and her abdominal pain is gone.  She will only occassionally have pain and it is usually when she eats red meat so she is trying to avoid it.    She has not rescheduled her eye appt yet for her retinal hemorrhages  sHe does not work      Relevant past medical, surgical, family and social history reviewed and updated as indicated. Interim medical history since our last visit reviewed. Allergies and medications reviewed and updated.    Current Outpatient Medications:    atorvastatin (LIPITOR) 10 MG tablet, Take 1 tablet (10 mg total) by mouth daily. Tome una tableta por boca diaria, Disp: 30 tablet, Rfl: 1   insulin NPH-regular Human (NOVOLIN 70/30 RELION) (70-30) 100 UNIT/ML injection, Inject 25 units SQ before breakfast and before evening meal.   Injecte 25 unidades subcutaneamente dos veces al dia con el alimento y antes de el alimento de la tarde, Disp: 20 mL, Rfl: 6   losartan (COZAAR) 50 MG tablet, Take 1 tablet (50 mg total) by mouth daily. Tome una tableta por boca diaria, Disp: 30 tablet, Rfl: 1   metFORMIN (GLUCOPHAGE) 1000 MG tablet, Take 1 tablet (1,000 mg total) by mouth 2 (two) times  daily with a meal. Tome una tableta por boca dos veces diarias con comida, Disp: 180 tablet, Rfl: 1   Omega-3 Fatty Acids (FISH OIL PO), Take 1 capsule by mouth 2 (two) times daily., Disp: , Rfl:    omeprazole (PRILOSEC) 40 MG capsule, 1 po bid.  Tome una tableta por boca dos veces diarias, Disp: 180 capsule, Rfl: 0   mupirocin ointment (BACTROBAN) 2 %, Apply 1 application topically 2 (two) times daily. (Patient not taking: Reported on 09/06/2020), Disp: 22 g, Rfl: 0   traMADol (ULTRAM) 50 MG tablet, Take 1 tablet (50 mg total) by mouth at bedtime as needed. (Patient not taking: Reported on 09/06/2020), Disp: 8 tablet, Rfl: 0     Review of Systems  Per HPI unless specifically indicated above     Objective:    BP 123/65   Pulse 82   Temp 98.3 F (36.8 C)   Wt 138 lb (62.6 kg)   LMP 03/04/2016 (Approximate)   SpO2 94%   BMI 28.35 kg/m   Wt Readings from Last 3 Encounters:  09/06/20 138 lb (62.6 kg)  05/30/20 135 lb (61.2 kg)  04/25/20 139 lb (63 kg)    Physical Exam Vitals reviewed.  Constitutional:      General: She is not in acute distress.    Appearance: She is well-developed. She is not ill-appearing.  HENT:     Head: Normocephalic and atraumatic.  Cardiovascular:     Rate and Rhythm: Normal rate and regular rhythm.  Pulmonary:     Effort: Pulmonary effort is normal.     Breath sounds: Normal breath sounds.  Abdominal:     General: Bowel sounds are normal.     Palpations: Abdomen is soft. There is no mass.     Tenderness: There is no abdominal tenderness.  Musculoskeletal:     Cervical back: Neck supple.     Right lower leg: No edema.     Left lower leg: No edema.  Lymphadenopathy:     Cervical: No cervical adenopathy.  Skin:    General: Skin is warm and dry.  Neurological:     Mental Status: She is alert and oriented to person, place, and time.  Psychiatric:        Behavior: Behavior normal.    Results for orders placed or performed during the hospital  encounter of 09/04/20  Lipid panel  Result Value Ref Range   Cholesterol 142 0 - 200 mg/dL   Triglycerides 749 <449 mg/dL   HDL 48 >67 mg/dL   Total CHOL/HDL Ratio 3.0 RATIO   VLDL 22 0 - 40 mg/dL   LDL Cholesterol 72 0 - 99 mg/dL  Comprehensive metabolic panel  Result Value Ref Range   Sodium 135 135 - 145 mmol/L   Potassium 4.0 3.5 - 5.1 mmol/L   Chloride 100 98 - 111 mmol/L   CO2 25 22 - 32 mmol/L   Glucose, Bld 297 (H) 70 - 99 mg/dL   BUN 18 6 - 20 mg/dL   Creatinine, Ser 5.91 0.44 - 1.00 mg/dL   Calcium 9.3 8.9 - 63.8 mg/dL   Total Protein 7.9 6.5 - 8.1 g/dL   Albumin 3.8 3.5 - 5.0 g/dL   AST 25 15 - 41 U/L   ALT 28 0 - 44 U/L   Alkaline Phosphatase 122 38 - 126 U/L   Total Bilirubin 0.8 0.3 - 1.2 mg/dL   GFR, Estimated >46 >65 mL/min   Anion gap 10 5 - 15  Hemoglobin A1c  Result Value Ref Range   Hgb A1c MFr Bld 11.1 (H) 4.8 - 5.6 %   Mean Plasma Glucose 271.87 mg/dL      Assessment & Plan:    Encounter Diagnoses  Name Primary?   Uncontrolled type 2 diabetes mellitus with complication (HCC) Yes   Primary hypertension    Gastroesophageal reflux disease, unspecified whether esophagitis present    Hyperlipidemia, unspecified hyperlipidemia type    Diabetic retinopathy associated with type 2 diabetes mellitus, macular edema presence unspecified, unspecified laterality, unspecified retinopathy severity (HCC)    Not proficient in [place] language      DM -reviewed labs with pt  -Increase insulin to 30 unit bid and mornitor bs -Counseled on diabetic foot care and proper shoes and gave reading information -pt to follow up 1 month with bs log.     HTN Pt to continue losartan;  coughing resolved    dyslipidemia Pt counseled to continue atorvastatin and follow lowfat diet   Abdominal pain  Improved.  discussed to continue omprazole bid for another 3 months and then re-evaluate.     Retinal Hemorrhages -pt is encouraged to call and reschedule her eye  appointment

## 2020-10-09 ENCOUNTER — Ambulatory Visit: Payer: Self-pay | Admitting: Physician Assistant

## 2020-10-09 DIAGNOSIS — Z789 Other specified health status: Secondary | ICD-10-CM

## 2020-10-09 DIAGNOSIS — E11319 Type 2 diabetes mellitus with unspecified diabetic retinopathy without macular edema: Secondary | ICD-10-CM

## 2020-10-09 DIAGNOSIS — E1165 Type 2 diabetes mellitus with hyperglycemia: Secondary | ICD-10-CM

## 2020-10-09 DIAGNOSIS — IMO0002 Reserved for concepts with insufficient information to code with codable children: Secondary | ICD-10-CM

## 2020-10-09 NOTE — Progress Notes (Signed)
LMP 03/04/2016 (Approximate)    Subjective:    Patient ID: Alice Perez, female    DOB: 1965/10/15, 55 y.o.   MRN: [id number]  HPI: Adam Demary is a 55 y.o. female presenting on 10/09/2020 for No chief complaint on file.   HPI   This is a telemedicine visit through [employer]  I connected with  Alice Perez on 10/09/20 by a video enabled telemedicine application and verified that I am speaking with the correct person using two identifiers.   I discussed the limitations of evaluation and management by telemedicine. The patient expressed understanding and agreed to proceed.  Pt is at home.  Provider and translator are at office.     Pt is 54yoF with appointment to follow up diabetes.  She is using 30 units of 70/30  insulin twice daily.    She has not rescheduled with the eye doctor yet.  she says she lost the phone number.    Pt reports recent FBS:   this morning- 140 Yesterday 230 Before that 104 100 130  Pt says she knows that she ate too much night before the 230 bs  After noon readings: 136, 95, 88, 105  She says Only once was afternoon over 200.  She has had no episodes of low blood sugar.        Relevant past medical, surgical, family and social history reviewed and updated as indicated. Interim medical history since our last visit reviewed. Allergies and medications reviewed and updated.   Current Outpatient Medications:    atorvastatin (LIPITOR) 10 MG tablet, Take 1 tablet (10 mg total) by mouth daily. Tome una tableta por boca diaria, Disp: 30 tablet, Rfl: 4   insulin NPH-regular Human (NOVOLIN 70/30 RELION) (70-30) 100 UNIT/ML injection, Inject 30 units SQ before breakfast and before evening meal.   Injecte 30 unidades subcutaneamente dos veces al dia con el alimento y antes de el alimento de la tarde, Disp: 20 mL, Rfl: 6   losartan (COZAAR) 50 MG tablet, Take 1 tablet (50 mg total) by mouth daily. Tome una tableta por boca diaria, Disp: 30 tablet, Rfl: 4    metFORMIN (GLUCOPHAGE) 1000 MG tablet, Take 1 tablet (1,000 mg total) by mouth 2 (two) times daily with a meal. Tome una tableta por boca dos veces diarias con comida, Disp: 60 tablet, Rfl: 4   Omega-3 Fatty Acids (FISH OIL PO), Take 1 capsule by mouth 2 (two) times daily., Disp: , Rfl:    omeprazole (PRILOSEC) 40 MG capsule, 1 po bid.  Tome una tableta por boca dos veces diarias, Disp: 180 capsule, Rfl: 0   mupirocin ointment (BACTROBAN) 2 %, Apply 1 application topically 2 (two) times daily. (Patient not taking: No sig reported), Disp: 22 g, Rfl: 0   traMADol (ULTRAM) 50 MG tablet, Take 1 tablet (50 mg total) by mouth at bedtime as needed. (Patient not taking: No sig reported), Disp: 8 tablet, Rfl: 0    Review of Systems  Per HPI unless specifically indicated above     Objective:    LMP 03/04/2016 (Approximate)   Wt Readings from Last 3 Encounters:  09/06/20 138 lb (62.6 kg)  05/30/20 135 lb (61.2 kg)  04/25/20 139 lb (63 kg)    Physical Exam Constitutional:      General: She is not in acute distress. HENT:     Head: Normocephalic and atraumatic.  Pulmonary:     Effort: No respiratory distress.     Comments: Pt is talking and  walking around her home without dyspnea Neurological:     Mental Status: She is alert and oriented to person, place, and time.  Psychiatric:        Attention and Perception: Attention normal.        Speech: Speech normal.        Behavior: Behavior is cooperative.           Assessment & Plan:    Encounter Diagnoses  Name Primary?   Uncontrolled type 2 diabetes mellitus with complication (HCC) Yes   Diabetic retinopathy associated with type 2 diabetes mellitus, macular edema presence unspecified, unspecified laterality, unspecified retinopathy severity (HCC)    Not proficient in [place] language      -no changes to current diabetic meds -pt is encouraged to Watch diabetic diet and continue to monitor her bs -she is reminded to call office  for fbs > 300 or < 70 -pt is given the phone number for  the eye doctor (at [facility]) and is encouraged to get her appointment rescheduled -pt to follow up 2 months.  She is to contact office sooner prn

## 2020-10-10 ENCOUNTER — Encounter: Payer: Self-pay | Admitting: Physician Assistant

## 2020-11-23 ENCOUNTER — Other Ambulatory Visit: Payer: Self-pay | Admitting: Physician Assistant

## 2020-11-23 DIAGNOSIS — I10 Essential (primary) hypertension: Secondary | ICD-10-CM

## 2020-11-23 DIAGNOSIS — E785 Hyperlipidemia, unspecified: Secondary | ICD-10-CM

## 2020-11-23 DIAGNOSIS — E1165 Type 2 diabetes mellitus with hyperglycemia: Secondary | ICD-10-CM

## 2020-12-05 ENCOUNTER — Ambulatory Visit: Payer: Self-pay | Admitting: Physician Assistant

## 2020-12-11 ENCOUNTER — Other Ambulatory Visit: Payer: Self-pay

## 2020-12-11 ENCOUNTER — Other Ambulatory Visit (HOSPITAL_COMMUNITY)
Admission: RE | Admit: 2020-12-11 | Discharge: 2020-12-11 | Disposition: A | Discharge status: Home / Self Care | Payer: Self-pay | Source: Ambulatory Visit | Attending: Physician Assistant | Admitting: Physician Assistant

## 2020-12-11 DIAGNOSIS — I10 Essential (primary) hypertension: Secondary | ICD-10-CM | POA: Insufficient documentation

## 2020-12-11 DIAGNOSIS — E1165 Type 2 diabetes mellitus with hyperglycemia: Secondary | ICD-10-CM | POA: Insufficient documentation

## 2020-12-11 DIAGNOSIS — E785 Hyperlipidemia, unspecified: Secondary | ICD-10-CM | POA: Insufficient documentation

## 2020-12-11 LAB — COMPREHENSIVE METABOLIC PANEL
ALT: 23 U/L (ref 0–44)
AST: 23 U/L (ref 15–41)
Albumin: 3.8 g/dL (ref 3.5–5.0)
Alkaline Phosphatase: 112 U/L (ref 38–126)
Anion gap: 8 (ref 5–15)
BUN: 13 mg/dL (ref 6–20)
CO2: 27 mmol/L (ref 22–32)
Calcium: 9.3 mg/dL (ref 8.9–10.3)
Chloride: 101 mmol/L (ref 98–111)
Creatinine, Ser: 0.36 mg/dL — ABNORMAL LOW (ref 0.44–1.00)
GFR, Estimated: >60 mL/min (ref >60)
Glucose, Bld: 204 mg/dL — ABNORMAL HIGH (ref 70–99)
Potassium: 4 mmol/L (ref 3.5–5.1)
Sodium: 136 mmol/L (ref 135–145)
Total Bilirubin: 0.5 mg/dL (ref 0.3–1.2)
Total Protein: 7.6 g/dL (ref 6.5–8.1)

## 2020-12-11 LAB — LIPID PANEL
Cholesterol: 128 mg/dL (ref 0–200)
HDL: 46 mg/dL (ref >40)
LDL Cholesterol: 69 mg/dL (ref 0–99)
Total CHOL/HDL Ratio: 2.8 RATIO
Triglycerides: 65 mg/dL (ref <150)
VLDL: 13 mg/dL (ref 0–40)

## 2020-12-12 ENCOUNTER — Other Ambulatory Visit: Payer: Self-pay | Admitting: Physician Assistant

## 2020-12-12 ENCOUNTER — Encounter: Payer: Self-pay | Admitting: Physician Assistant

## 2020-12-12 ENCOUNTER — Ambulatory Visit: Payer: Self-pay | Admitting: Physician Assistant

## 2020-12-12 VITALS — BP 153/84 | HR 98 | Temp 98.0°F | Wt 143.5 lb

## 2020-12-12 DIAGNOSIS — Z789 Other specified health status: Secondary | ICD-10-CM

## 2020-12-12 DIAGNOSIS — E1165 Type 2 diabetes mellitus with hyperglycemia: Secondary | ICD-10-CM

## 2020-12-12 DIAGNOSIS — I10 Essential (primary) hypertension: Secondary | ICD-10-CM

## 2020-12-12 DIAGNOSIS — Z1211 Encounter for screening for malignant neoplasm of colon: Secondary | ICD-10-CM

## 2020-12-12 DIAGNOSIS — E785 Hyperlipidemia, unspecified: Secondary | ICD-10-CM

## 2020-12-12 LAB — HEMOGLOBIN A1C
Hgb A1c MFr Bld: 10.2 % — ABNORMAL HIGH (ref 4.8–5.6)
Mean Plasma Glucose: 246 mg/dL

## 2020-12-12 MED ORDER — NOVOLIN 70/30 RELION (70-30) 100 UNIT/ML ~~LOC~~ SUSP: SUBCUTANEOUS | 6 refills | Status: DC

## 2020-12-12 MED ORDER — ATORVASTATIN CALCIUM 10 MG PO TABS: 10.0000 mg | ORAL_TABLET | Freq: Every day | ORAL | 4 refills | Status: DC

## 2020-12-12 NOTE — Progress Notes (Signed)
BP (!) 160/78   Pulse 98   Temp 98 F (36.7 C)   Wt 143 lb 8 oz (65.1 kg)   LMP 03/04/2016 (Approximate)   SpO2 (!) 84%   BMI 29.48 kg/m    Subjective:    Patient ID: Alice Perez, female    DOB: 1965-04-04, 55 y.o.   MRN: [id number]  HPI: Alice Perez is a 55 y.o. female presenting on 12/12/2020 for Hyperlipidemia, Hypertension, and Diabetes   HPI   Chief Complaint  Patient presents with   Hyperlipidemia   Hypertension   Diabetes     She is using 30u insulin bid.  She checks her bs but doesn't write it down  She says 274 highest.  76 lowest.  This am 210.   When it was 76, she had skipped eating.   She is Worried about her son with whom she is having problems.  She isn't sleeping.  She has a little depression.  She cries sometimes, mostly at night.   She denies SI, HI.     Relevant past medical, surgical, family and social history reviewed and updated as indicated. Interim medical history since our last visit reviewed. Allergies and medications reviewed and updated.   Current Outpatient Medications:    atorvastatin (LIPITOR) 10 MG tablet, Take 1 tablet (10 mg total) by mouth daily. Tome una tableta por boca diaria, Disp: 30 tablet, Rfl: 4   insulin NPH-regular Human (NOVOLIN 70/30 RELION) (70-30) 100 UNIT/ML injection, Inject 30 units SQ before breakfast and before evening meal.   Injecte 30 unidades subcutaneamente dos veces al dia con el alimento y antes de el alimento de la tarde, Disp: 20 mL, Rfl: 6   losartan (COZAAR) 50 MG tablet, Take 1 tablet (50 mg total) by mouth daily. Tome una tableta por boca diaria, Disp: 30 tablet, Rfl: 4   metFORMIN (GLUCOPHAGE) 1000 MG tablet, Take 1 tablet (1,000 mg total) by mouth 2 (two) times daily with a meal. Tome una tableta por boca dos veces diarias con comida, Disp: 60 tablet, Rfl: 4   Omega-3 Fatty Acids (FISH OIL PO), Take 1 capsule by mouth 2 (two) times daily., Disp: , Rfl:    omeprazole (PRILOSEC) 40 MG capsule, 1 po  bid.  Tome una tableta por boca dos veces diarias, Disp: 180 capsule, Rfl: 0   mupirocin ointment (BACTROBAN) 2 %, Apply 1 application topically 2 (two) times daily. (Patient not taking: No sig reported), Disp: 22 g, Rfl: 0   traMADol (ULTRAM) 50 MG tablet, Take 1 tablet (50 mg total) by mouth at bedtime as needed. (Patient not taking: No sig reported), Disp: 8 tablet, Rfl: 0   Review of Systems  Per HPI unless specifically indicated above     Objective:    BP (!) 160/78   Pulse 98   Temp 98 F (36.7 C)   Wt 143 lb 8 oz (65.1 kg)   LMP 03/04/2016 (Approximate)   SpO2 (!) 84%   BMI 29.48 kg/m   Wt Readings from Last 3 Encounters:  12/12/20 143 lb 8 oz (65.1 kg)  09/06/20 138 lb (62.6 kg)  05/30/20 135 lb (61.2 kg)    Physical Exam Vitals reviewed.  Constitutional:      General: She is not in acute distress.    Appearance: She is well-developed. She is not ill-appearing.  HENT:     Head: Normocephalic and atraumatic.  Cardiovascular:     Rate and Rhythm: Normal rate and regular rhythm.  Pulmonary:  Effort: Pulmonary effort is normal.     Breath sounds: Normal breath sounds.  Abdominal:     General: Bowel sounds are normal.     Palpations: Abdomen is soft. There is no mass.     Tenderness: There is no abdominal tenderness.  Musculoskeletal:     Cervical back: Neck supple.     Right lower leg: No edema.     Left lower leg: No edema.  Lymphadenopathy:     Cervical: No cervical adenopathy.  Skin:    General: Skin is warm and dry.  Neurological:     Mental Status: She is alert and oriented to person, place, and time.  Psychiatric:        Attention and Perception: Attention normal.        Mood and Affect: Affect is not inappropriate.        Speech: Speech normal.        Behavior: Behavior normal. Behavior is cooperative.    Results for orders placed or performed during the hospital encounter of 12/11/20  Hemoglobin A1c  Result Value Ref Range   Hgb A1c MFr Bld  10.2 (H) 4.8 - 5.6 %   Mean Plasma Glucose 246 mg/dL  Lipid panel  Result Value Ref Range   Cholesterol 128 0 - 200 mg/dL   Triglycerides 65 <852 mg/dL   HDL 46 >77 mg/dL   Total CHOL/HDL Ratio 2.8 RATIO   VLDL 13 0 - 40 mg/dL   LDL Cholesterol 69 0 - 99 mg/dL  Comprehensive metabolic panel  Result Value Ref Range   Sodium 136 135 - 145 mmol/L   Potassium 4.0 3.5 - 5.1 mmol/L   Chloride 101 98 - 111 mmol/L   CO2 27 22 - 32 mmol/L   Glucose, Bld 204 (H) 70 - 99 mg/dL   BUN 13 6 - 20 mg/dL   Creatinine, Ser 8.24 (L) 0.44 - 1.00 mg/dL   Calcium 9.3 8.9 - 23.5 mg/dL   Total Protein 7.6 6.5 - 8.1 g/dL   Albumin 3.8 3.5 - 5.0 g/dL   AST 23 15 - 41 U/L   ALT 23 0 - 44 U/L   Alkaline Phosphatase 112 38 - 126 U/L   Total Bilirubin 0.5 0.3 - 1.2 mg/dL   GFR, Estimated >36 >14 mL/min   Anion gap 8 5 - 15      Assessment & Plan:   Encounter Diagnoses  Name Primary?   Uncontrolled type 2 diabetes mellitus with hyperglycemia (HCC) Yes   Primary hypertension    Hyperlipidemia, unspecified hyperlipidemia type    Screening for colon cancer    Not proficient in [place] language      -Reviewed labs with pt -pt Declined RD referral.  She says she knows what to eat, she just isn't -will Increase insulin to 35units bid.  Pt is counseled to monitor bs. She is given bs log.  She is reminded to call office for fbs < 70 or > 300.  She is encouraged to avoid skipping meals. -Discussed ssri- to help with her mood during this difficult time.  she declined.  She is to contact office if she changes her mind -DM foot exam updated -pt is given FIT test for colon cancer screening -pt to follow up  3 months.  She is to contact office sooner prn

## 2021-03-07 ENCOUNTER — Other Ambulatory Visit: Payer: Self-pay | Admitting: Physician Assistant

## 2021-03-07 DIAGNOSIS — E1165 Type 2 diabetes mellitus with hyperglycemia: Secondary | ICD-10-CM

## 2021-03-07 DIAGNOSIS — E785 Hyperlipidemia, unspecified: Secondary | ICD-10-CM

## 2021-03-07 DIAGNOSIS — I10 Essential (primary) hypertension: Secondary | ICD-10-CM

## 2021-03-16 ENCOUNTER — Other Ambulatory Visit: Payer: Self-pay

## 2021-03-16 ENCOUNTER — Other Ambulatory Visit (HOSPITAL_COMMUNITY)
Admission: RE | Admit: 2021-03-16 | Discharge: 2021-03-16 | Disposition: A | Discharge status: Home / Self Care | Payer: Self-pay | Source: Ambulatory Visit | Attending: Physician Assistant | Admitting: Physician Assistant

## 2021-03-16 DIAGNOSIS — I10 Essential (primary) hypertension: Secondary | ICD-10-CM | POA: Insufficient documentation

## 2021-03-16 DIAGNOSIS — E1165 Type 2 diabetes mellitus with hyperglycemia: Secondary | ICD-10-CM | POA: Insufficient documentation

## 2021-03-16 DIAGNOSIS — E785 Hyperlipidemia, unspecified: Secondary | ICD-10-CM | POA: Insufficient documentation

## 2021-03-16 LAB — COMPREHENSIVE METABOLIC PANEL
ALT: 34 U/L (ref 0–44)
AST: 24 U/L (ref 15–41)
Albumin: 4 g/dL (ref 3.5–5.0)
Alkaline Phosphatase: 140 U/L — ABNORMAL HIGH (ref 38–126)
Anion gap: 9 (ref 5–15)
BUN: 17 mg/dL (ref 6–20)
CO2: 27 mmol/L (ref 22–32)
Calcium: 9.4 mg/dL (ref 8.9–10.3)
Chloride: 96 mmol/L — ABNORMAL LOW (ref 98–111)
Creatinine, Ser: 0.63 mg/dL (ref 0.44–1.00)
GFR, Estimated: >60 mL/min (ref >60)
Glucose, Bld: 398 mg/dL — ABNORMAL HIGH (ref 70–99)
Potassium: 3.9 mmol/L (ref 3.5–5.1)
Sodium: 132 mmol/L — ABNORMAL LOW (ref 135–145)
Total Bilirubin: 0.3 mg/dL (ref 0.3–1.2)
Total Protein: 8.3 g/dL — ABNORMAL HIGH (ref 6.5–8.1)

## 2021-03-16 LAB — LIPID PANEL
Cholesterol: 209 mg/dL — ABNORMAL HIGH (ref 0–200)
HDL: 48 mg/dL (ref >40)
LDL Cholesterol: 133 mg/dL — ABNORMAL HIGH (ref 0–99)
Total CHOL/HDL Ratio: 4.4 RATIO
Triglycerides: 141 mg/dL (ref <150)
VLDL: 28 mg/dL (ref 0–40)

## 2021-03-16 LAB — HEMOGLOBIN A1C
Hgb A1c MFr Bld: 10.4 % — ABNORMAL HIGH (ref 4.8–5.6)
Mean Plasma Glucose: 251.78 mg/dL

## 2021-03-19 ENCOUNTER — Ambulatory Visit: Payer: Self-pay | Admitting: Physician Assistant

## 2021-03-19 ENCOUNTER — Other Ambulatory Visit: Payer: Self-pay

## 2021-03-19 ENCOUNTER — Encounter: Payer: Self-pay | Admitting: Physician Assistant

## 2021-03-19 VITALS — BP 120/72 | HR 87 | Temp 96.2°F | Wt 143.0 lb

## 2021-03-19 DIAGNOSIS — Z1239 Encounter for other screening for malignant neoplasm of breast: Secondary | ICD-10-CM

## 2021-03-19 DIAGNOSIS — E1165 Type 2 diabetes mellitus with hyperglycemia: Secondary | ICD-10-CM

## 2021-03-19 DIAGNOSIS — Z789 Other specified health status: Secondary | ICD-10-CM

## 2021-03-19 DIAGNOSIS — E785 Hyperlipidemia, unspecified: Secondary | ICD-10-CM

## 2021-03-19 DIAGNOSIS — I10 Essential (primary) hypertension: Secondary | ICD-10-CM

## 2021-03-19 MED ORDER — OMEPRAZOLE 40 MG PO CPDR: DELAYED_RELEASE_CAPSULE | ORAL | Status: DC

## 2021-03-19 MED ORDER — METFORMIN HCL 1000 MG PO TABS: 1000.0000 mg | ORAL_TABLET | Freq: Two times a day (BID) | ORAL | 4 refills | Status: DC

## 2021-03-19 MED ORDER — ATORVASTATIN CALCIUM 20 MG PO TABS: 20.0000 mg | ORAL_TABLET | Freq: Every day | ORAL | 3 refills | Status: DC

## 2021-03-19 MED ORDER — NOVOLIN 70/30 RELION (70-30) 100 UNIT/ML ~~LOC~~ SUSP: SUBCUTANEOUS | 6 refills | Status: DC

## 2021-03-19 MED ORDER — LOSARTAN POTASSIUM 50 MG PO TABS: 50.0000 mg | ORAL_TABLET | Freq: Every day | ORAL | 4 refills | Status: DC

## 2021-03-19 NOTE — Progress Notes (Signed)
BP 120/72    Pulse 87    Temp (!) 96.2 F (35.7 C)    Wt 143 lb (64.9 kg)    LMP 03/04/2016 (Approximate)    SpO2 99%    BMI 29.38 kg/m    Subjective:    Patient ID: Alice Perez, female    DOB: 1965-08-25, 56 y.o.   MRN: [id number]  HPI: Alice Perez is a 56 y.o. female presenting on 03/19/2021 for Diabetes, Hypertension, and Hyperlipidemia   HPI  Chief Complaint  Patient presents with   Diabetes   Hypertension   Hyperlipidemia      Pt says she checks her bs sometimes.  It's been about 2 weeks since she checked it.  It was running - 292.   Pt was told to monitor bs at her last appt but she has no log and as just stated hasn't chedked her bs in 2 weeks.   She says she only had one time when it was low and she had skipped eating when that happened.      Relevant past medical, surgical, family and social history reviewed and updated as indicated. Interim medical history since our last visit reviewed. Allergies and medications reviewed and updated.    Current Outpatient Medications:    insulin NPH-regular Human (NOVOLIN 70/30 RELION) (70-30) 100 UNIT/ML injection, Inject 35 units SQ before breakfast and before evening meal.   Injecte 35 unidades subcutaneamente dos veces al dia con el alimento y antes de el alimento de la tarde, Disp: 35 mL, Rfl: 6   metFORMIN (GLUCOPHAGE) 1000 MG tablet, Take 1 tablet (1,000 mg total) by mouth 2 (two) times daily with a meal. Tome una tableta por boca dos veces diarias con comida, Disp: 60 tablet, Rfl: 4   Omega-3 Fatty Acids (FISH OIL PO), Take 1 capsule by mouth 2 (two) times daily., Disp: , Rfl:    omeprazole (PRILOSEC) 40 MG capsule, 1 po bid.  Tome una tableta por boca dos veces diarias, Disp: 180 capsule, Rfl: 0   atorvastatin (LIPITOR) 10 MG tablet, Take 1 tablet (10 mg total) by mouth daily. Tome una tableta por boca diaria (Patient not taking: Reported on 03/19/2021), Disp: 30 tablet, Rfl: 4   losartan (COZAAR) 50 MG tablet, Take 1 tablet  (50 mg total) by mouth daily. Tome una tableta por boca diaria (Patient not taking: Reported on 03/19/2021), Disp: 30 tablet, Rfl: 4   Review of Systems  Per HPI unless specifically indicated above     Objective:    BP 120/72    Pulse 87    Temp (!) 96.2 F (35.7 C)    Wt 143 lb (64.9 kg)    LMP 03/04/2016 (Approximate)    SpO2 99%    BMI 29.38 kg/m   Wt Readings from Last 3 Encounters:  03/19/21 143 lb (64.9 kg)  12/12/20 143 lb 8 oz (65.1 kg)  09/06/20 138 lb (62.6 kg)    Physical Exam Vitals reviewed.  Constitutional:      General: She is not in acute distress.    Appearance: She is well-developed. She is not ill-appearing.  HENT:     Head: Normocephalic and atraumatic.  Cardiovascular:     Rate and Rhythm: Normal rate and regular rhythm.  Pulmonary:     Effort: Pulmonary effort is normal.     Breath sounds: Normal breath sounds.  Abdominal:     General: Bowel sounds are normal.     Palpations: Abdomen is soft. There is  no mass.     Tenderness: There is no abdominal tenderness.  Musculoskeletal:     Cervical back: Neck supple.     Right lower leg: No edema.     Left lower leg: No edema.  Lymphadenopathy:     Cervical: No cervical adenopathy.  Skin:    General: Skin is warm and dry.  Neurological:     Mental Status: She is alert and oriented to person, place, and time.  Psychiatric:        Behavior: Behavior normal.    Results for orders placed or performed during the hospital encounter of 03/16/21  Lipid panel  Result Value Ref Range   Cholesterol 209 (H) 0 - 200 mg/dL   Triglycerides 141 <150 mg/dL   HDL 48 >40 mg/dL   Total CHOL/HDL Ratio 4.4 RATIO   VLDL 28 0 - 40 mg/dL   LDL Cholesterol 133 (H) 0 - 99 mg/dL  Hemoglobin A1c  Result Value Ref Range   Hgb A1c MFr Bld 10.4 (H) 4.8 - 5.6 %   Mean Plasma Glucose 251.78 mg/dL  Comprehensive metabolic panel  Result Value Ref Range   Sodium 132 (L) 135 - 145 mmol/L   Potassium 3.9 3.5 - 5.1 mmol/L    Chloride 96 (L) 98 - 111 mmol/L   CO2 27 22 - 32 mmol/L   Glucose, Bld 398 (H) 70 - 99 mg/dL   BUN 17 6 - 20 mg/dL   Creatinine, Ser 0.63 0.44 - 1.00 mg/dL   Calcium 9.4 8.9 - 10.3 mg/dL   Total Protein 8.3 (H) 6.5 - 8.1 g/dL   Albumin 4.0 3.5 - 5.0 g/dL   AST 24 15 - 41 U/L   ALT 34 0 - 44 U/L   Alkaline Phosphatase 140 (H) 38 - 126 U/L   Total Bilirubin 0.3 0.3 - 1.2 mg/dL   GFR, Estimated >60 >60 mL/min   Anion gap 9 5 - 15      Assessment & Plan:   Encounter Diagnoses  Name Primary?   Uncontrolled type 2 diabetes mellitus with hyperglycemia ([place]) Yes   Primary hypertension    Encounter for screening for malignant neoplasm of breast, unspecified screening modality    Hyperlipidemia, unspecified hyperlipidemia type    Not proficient in [place] language      -reviewed labs with pt -Increase lipitor and counseled to follow lowfat diet -pt is instructed Update with [facility]; her enrollment expired in December -Will send her rx to [place] but pt aware she must update before the meds will be sent (her medassist is expired) -discussed with pt the negative effects of having uncontrolled bs.  Discussed that she Needs to monitor bs.  She is reminded to call office for fbs < 70 or > 300.  She is to increase her insulin to 40u bid -will refer for screening mammogram -pt reminded to return FIT test given to her in October 2022 appointment for colon cancer screening -pt to F/u 6 wk to check bs log.  She is to contact office sooner prn

## 2021-03-21 ENCOUNTER — Other Ambulatory Visit: Payer: Self-pay

## 2021-03-21 DIAGNOSIS — Z1231 Encounter for screening mammogram for malignant neoplasm of breast: Secondary | ICD-10-CM

## 2021-03-30 ENCOUNTER — Inpatient Hospital Stay (HOSPITAL_COMMUNITY): Admission: RE | Admit: 2021-03-30 | Payer: Self-pay | Source: Ambulatory Visit

## 2021-04-30 ENCOUNTER — Ambulatory Visit: Payer: Self-pay | Admitting: Physician Assistant

## 2021-08-16 ENCOUNTER — Other Ambulatory Visit: Payer: Self-pay | Admitting: Physician Assistant

## 2021-09-21 ENCOUNTER — Other Ambulatory Visit: Payer: Self-pay | Admitting: Nurse Practitioner

## 2021-09-21 ENCOUNTER — Other Ambulatory Visit (HOSPITAL_COMMUNITY): Payer: Self-pay | Admitting: Nurse Practitioner

## 2021-09-21 DIAGNOSIS — M79604 Pain in right leg: Secondary | ICD-10-CM

## 2021-10-03 ENCOUNTER — Ambulatory Visit
Admission: RE | Admit: 2021-10-03 | Discharge: 2021-10-03 | Disposition: A | Discharge status: Home / Self Care | Payer: Self-pay | Source: Ambulatory Visit | Attending: Nurse Practitioner | Admitting: Nurse Practitioner

## 2021-10-03 DIAGNOSIS — M79604 Pain in right leg: Secondary | ICD-10-CM | POA: Insufficient documentation
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
## 2022-05-01 ENCOUNTER — Other Ambulatory Visit: Payer: Self-pay

## 2022-05-01 DIAGNOSIS — E118 Type 2 diabetes mellitus with unspecified complications: Secondary | ICD-10-CM

## 2022-05-01 DIAGNOSIS — I10 Essential (primary) hypertension: Secondary | ICD-10-CM

## 2022-05-01 DIAGNOSIS — E785 Hyperlipidemia, unspecified: Secondary | ICD-10-CM

## 2022-05-01 DIAGNOSIS — E669 Obesity, unspecified: Secondary | ICD-10-CM

## 2022-05-02 ENCOUNTER — Other Ambulatory Visit: Payer: Self-pay

## 2022-05-03 ENCOUNTER — Ambulatory Visit: Payer: Self-pay | Admitting: Nurse Practitioner

## 2022-05-03 LAB — LIPID PANEL
Chol/HDL Ratio: 2.9 ratio (ref 0.0–4.4)
Cholesterol, Total: 116 mg/dL (ref 100–199)
HDL: 40 mg/dL (ref >39)
LDL Chol Calc (NIH): 55 mg/dL (ref 0–99)
Triglycerides: 117 mg/dL (ref 0–149)
VLDL Cholesterol Cal: 21 mg/dL (ref 5–40)

## 2022-05-03 LAB — CMP14+EGFR
ALT: 16 IU/L (ref 0–32)
AST: 18 IU/L (ref 0–40)
Albumin/Globulin Ratio: 1.2 (ref 1.2–2.2)
Albumin: 4.2 g/dL (ref 3.8–4.9)
Alkaline Phosphatase: 158 IU/L — ABNORMAL HIGH (ref 44–121)
BUN/Creatinine Ratio: 38 — ABNORMAL HIGH (ref 9–23)
BUN: 18 mg/dL (ref 6–24)
Bilirubin Total: 0.3 mg/dL (ref 0.0–1.2)
CO2: 21 mmol/L (ref 20–29)
Calcium: 9.5 mg/dL (ref 8.7–10.2)
Chloride: 101 mmol/L (ref 96–106)
Creatinine, Ser: 0.47 mg/dL — ABNORMAL LOW (ref 0.57–1.00)
Globulin, Total: 3.5 g/dL (ref 1.5–4.5)
Glucose: 134 mg/dL — ABNORMAL HIGH (ref 70–99)
Potassium: 4.3 mmol/L (ref 3.5–5.2)
Sodium: 137 mmol/L (ref 134–144)
Total Protein: 7.7 g/dL (ref 6.0–8.5)
eGFR: 113 mL/min/{1.73_m2} (ref >59)

## 2022-05-03 LAB — HEMOGLOBIN A1C
Est. average glucose Bld gHb Est-mCnc: 246 mg/dL
Hgb A1c MFr Bld: 10.2 % — ABNORMAL HIGH (ref 4.8–5.6)

## 2022-05-03 LAB — TSH: TSH: 2.72 u[IU]/mL (ref 0.450–4.500)

## 2022-05-09 ENCOUNTER — Encounter: Payer: Self-pay | Admitting: Nurse Practitioner

## 2022-05-09 ENCOUNTER — Ambulatory Visit: Payer: Self-pay | Admitting: Nurse Practitioner

## 2022-05-09 VITALS — BP 140/70 | HR 72 | Ht 60.0 in | Wt 137.8 lb

## 2022-05-09 DIAGNOSIS — E118 Type 2 diabetes mellitus with unspecified complications: Secondary | ICD-10-CM

## 2022-05-09 DIAGNOSIS — D649 Anemia, unspecified: Secondary | ICD-10-CM

## 2022-05-09 DIAGNOSIS — E782 Mixed hyperlipidemia: Secondary | ICD-10-CM

## 2022-05-09 NOTE — Patient Instructions (Signed)
1) Less sugars, less sodas 2) Follow up appt in 3 months, 2 weeks, fasting labs prior

## 2022-05-09 NOTE — Progress Notes (Signed)
Established Patient Office Visit  Subjective:  Patient ID: Alice Perez, female    DOB: March 24, 1965  Age: 57 y.o. MRN: [id number]  Chief Complaint  Patient presents with   Follow-up    2 week follow up, lab results.    Follow up.  See if labs have been done.  A1c is at 10.2% and LDL is at goal at 55 mg/dl.  Patient feeling well overall, sore throat that may be seasonal allergies.      No other concerns at this time.   Past Medical History:  Diagnosis Date   Diabetes mellitus without complication ([place])    Hypertension     Past Surgical History:  Procedure Laterality Date   CESAREAN SECTION     x3   CHOLECYSTECTOMY     TUBAL LIGATION      Social History   Socioeconomic History   Marital status: Married    Spouse name: Not on file   Number of children: Not on file   Years of education: Not on file   Highest education level: Not on file  Occupational History   Not on file  Tobacco Use   Smoking status: Never   Smokeless tobacco: Never  Substance and Sexual Activity   Alcohol use: Yes    Comment: ocassionally   Drug use: No   Sexual activity: Not on file  Other Topics Concern   Not on file  Social History Narrative   Not on file   Social Determinants of Health   Financial Resource Strain: Not on file  Food Insecurity: Not on file  Transportation Needs: Not on file  Physical Activity: Not on file  Stress: Not on file  Social Connections: Not on file  Intimate Partner Violence: Not on file    Family History  Problem Relation Age of Onset   Hypertension Mother    Diabetes Father    Heart attack Father    Cancer Brother     No Known Allergies  Review of Systems  Constitutional: Negative.   HENT:  Positive for sore throat.   Eyes: Negative.   Respiratory: Negative.    Cardiovascular: Negative.   Gastrointestinal: Negative.   Genitourinary: Negative.   Musculoskeletal: Negative.   Skin: Negative.   Neurological: Negative.    Endo/Heme/Allergies: Negative.   Psychiatric/Behavioral: Negative.         Objective:   BP (!) 140/70   Pulse 72   Ht 5' (1.524 m)   Wt 137 lb 12.8 oz (62.5 kg)   LMP 03/04/2016 (Approximate)   SpO2 97%   BMI 26.91 kg/m   Vitals:   05/09/22 0855  BP: (!) 140/70  Pulse: 72  Height: 5' (1.524 m)  Weight: 137 lb 12.8 oz (62.5 kg)  SpO2: 97%  BMI (Calculated): 26.91    Physical Exam Vitals reviewed.  Constitutional:      Appearance: Normal appearance.  HENT:     Head: Normocephalic.     Nose: Nose normal.     Mouth/Throat:     Mouth: Mucous membranes are moist.  Eyes:     Pupils: Pupils are equal, round, and reactive to light.  Cardiovascular:     Rate and Rhythm: Normal rate and regular rhythm.  Pulmonary:     Effort: Pulmonary effort is normal.     Breath sounds: Normal breath sounds.  Abdominal:     General: Bowel sounds are normal.     Palpations: Abdomen is soft.  Musculoskeletal:  General: Normal range of motion.     Cervical back: Normal range of motion and neck supple.  Skin:    General: Skin is warm and dry.  Neurological:     Mental Status: She is alert and oriented to person, place, and time.  Psychiatric:        Mood and Affect: Mood normal.        Behavior: Behavior normal.      No results found for any visits on 05/09/22.  Recent Results (from the past 2160 hour(s))  Hemoglobin A1c     Status: Abnormal   Collection Time: 05/02/22  8:55 AM  Result Value Ref Range   Hgb A1c MFr Bld 10.2 (H) 4.8 - 5.6 %    Comment:          Prediabetes: 5.7 - 6.4          Diabetes: >6.4          Glycemic control for adults with diabetes: <7.0    Est. average glucose Bld gHb Est-mCnc 246 mg/dL  TSH     Status: None   Collection Time: 05/02/22  8:55 AM  Result Value Ref Range   TSH 2.720 0.450 - 4.500 uIU/mL  CMP14+EGFR     Status: Abnormal   Collection Time: 05/02/22  8:55 AM  Result Value Ref Range   Glucose 134 (H) 70 - 99 mg/dL   BUN 18  6 - 24 mg/dL   Creatinine, Ser 0.47 (L) 0.57 - 1.00 mg/dL   eGFR 113 >59 mL/min/1.73   BUN/Creatinine Ratio 38 (H) 9 - 23   Sodium 137 134 - 144 mmol/L   Potassium 4.3 3.5 - 5.2 mmol/L   Chloride 101 96 - 106 mmol/L   CO2 21 20 - 29 mmol/L   Calcium 9.5 8.7 - 10.2 mg/dL   Total Protein 7.7 6.0 - 8.5 g/dL   Albumin 4.2 3.8 - 4.9 g/dL   Globulin, Total 3.5 1.5 - 4.5 g/dL   Albumin/Globulin Ratio 1.2 1.2 - 2.2   Bilirubin Total 0.3 0.0 - 1.2 mg/dL   Alkaline Phosphatase 158 (H) 44 - 121 IU/L   AST 18 0 - 40 IU/L   ALT 16 0 - 32 IU/L  Lipid panel     Status: None   Collection Time: 05/02/22  8:55 AM  Result Value Ref Range   Cholesterol, Total 116 100 - 199 mg/dL   Triglycerides 117 0 - 149 mg/dL   HDL 40 >39 mg/dL   VLDL Cholesterol Cal 21 5 - 40 mg/dL   LDL Chol Calc (NIH) 55 0 - 99 mg/dL   Chol/HDL Ratio 2.9 0.0 - 4.4 ratio    Comment:                                   T. Chol/HDL Ratio                                             Men  Women                               1/2 Avg.Risk  3.4    3.3  Avg.Risk  5.0    4.4                                2X Avg.Risk  9.6    7.1                                3X Avg.Risk 23.4   11.0       Assessment & Plan:   Problem List Items Addressed This Visit       Endocrine   Type 2 diabetes mellitus with complication (HCC)   Relevant Medications   glipiZIDE (GLUCOTROL XL) 10 MG 24 hr tablet     Other   Absolute anemia   Hyperlipidemia - Primary    No follow-ups on file.   Total time spent: 35 minutes  Evern Bio, NP  05/09/2022

## 2022-06-10 ENCOUNTER — Ambulatory Visit: Payer: Self-pay | Admitting: Nurse Practitioner

## 2022-08-08 ENCOUNTER — Other Ambulatory Visit: Payer: Self-pay

## 2022-08-08 ENCOUNTER — Other Ambulatory Visit: Payer: Self-pay | Admitting: Internal Medicine

## 2022-08-08 DIAGNOSIS — E782 Mixed hyperlipidemia: Secondary | ICD-10-CM

## 2022-08-08 DIAGNOSIS — K219 Gastro-esophageal reflux disease without esophagitis: Secondary | ICD-10-CM

## 2022-08-08 DIAGNOSIS — E118 Type 2 diabetes mellitus with unspecified complications: Secondary | ICD-10-CM

## 2022-08-08 DIAGNOSIS — I10 Essential (primary) hypertension: Secondary | ICD-10-CM

## 2022-08-12 ENCOUNTER — Ambulatory Visit: Payer: Self-pay | Admitting: Nurse Practitioner

## 2022-08-12 ENCOUNTER — Ambulatory Visit: Payer: Self-pay | Admitting: Internal Medicine

## 2022-08-15 ENCOUNTER — Other Ambulatory Visit: Payer: Self-pay

## 2022-08-16 ENCOUNTER — Ambulatory Visit: Payer: Self-pay | Admitting: Internal Medicine

## 2022-08-16 ENCOUNTER — Encounter: Payer: Self-pay | Admitting: Internal Medicine

## 2022-08-16 VITALS — BP 112/66 | HR 81 | Ht 60.0 in | Wt 134.0 lb

## 2022-08-16 DIAGNOSIS — E782 Mixed hyperlipidemia: Secondary | ICD-10-CM

## 2022-08-16 DIAGNOSIS — E1165 Type 2 diabetes mellitus with hyperglycemia: Secondary | ICD-10-CM

## 2022-08-16 DIAGNOSIS — I10 Essential (primary) hypertension: Secondary | ICD-10-CM

## 2022-08-16 LAB — CMP14+EGFR
ALT: 20 IU/L (ref 0–32)
AST: 19 IU/L (ref 0–40)
Albumin: 4.1 g/dL (ref 3.8–4.9)
Alkaline Phosphatase: 171 IU/L — ABNORMAL HIGH (ref 44–121)
BUN/Creatinine Ratio: 29 — ABNORMAL HIGH (ref 9–23)
BUN: 16 mg/dL (ref 6–24)
Bilirubin Total: 0.5 mg/dL (ref 0.0–1.2)
CO2: 23 mmol/L (ref 20–29)
Calcium: 9.7 mg/dL (ref 8.7–10.2)
Chloride: 100 mmol/L (ref 96–106)
Creatinine, Ser: 0.56 mg/dL — ABNORMAL LOW (ref 0.57–1.00)
Globulin, Total: 3.2 g/dL (ref 1.5–4.5)
Glucose: 307 mg/dL — ABNORMAL HIGH (ref 70–99)
Potassium: 4.5 mmol/L (ref 3.5–5.2)
Sodium: 136 mmol/L (ref 134–144)
Total Protein: 7.3 g/dL (ref 6.0–8.5)
eGFR: 108 mL/min/{1.73_m2} (ref >59)

## 2022-08-16 LAB — CBC WITH DIFFERENTIAL
Basophils Absolute: 0 10*3/uL (ref 0.0–0.2)
Basos: 0 %
EOS (ABSOLUTE): 0.1 10*3/uL (ref 0.0–0.4)
Eos: 2 %
Hematocrit: 39.7 % (ref 34.0–46.6)
Hemoglobin: 13.1 g/dL (ref 11.1–15.9)
Immature Grans (Abs): 0 10*3/uL (ref 0.0–0.1)
Immature Granulocytes: 0 %
Lymphocytes Absolute: 2.5 10*3/uL (ref 0.7–3.1)
Lymphs: 37 %
MCH: 28.2 pg (ref 26.6–33.0)
MCHC: 33 g/dL (ref 31.5–35.7)
MCV: 86 fL (ref 79–97)
Monocytes Absolute: 0.5 10*3/uL (ref 0.1–0.9)
Monocytes: 8 %
Neutrophils Absolute: 3.6 10*3/uL (ref 1.4–7.0)
Neutrophils: 53 %
RBC: 4.64 x10E6/uL (ref 3.77–5.28)
RDW: 12 % (ref 11.7–15.4)
WBC: 6.8 10*3/uL (ref 3.4–10.8)

## 2022-08-16 LAB — LIPID PANEL W/O CHOL/HDL RATIO
Cholesterol, Total: 135 mg/dL (ref 100–199)
HDL: 46 mg/dL (ref >39)
LDL Chol Calc (NIH): 67 mg/dL (ref 0–99)
Triglycerides: 120 mg/dL (ref 0–149)
VLDL Cholesterol Cal: 22 mg/dL (ref 5–40)

## 2022-08-16 LAB — HEMOGLOBIN A1C
Est. average glucose Bld gHb Est-mCnc: 278 mg/dL
Hgb A1c MFr Bld: 11.3 % — ABNORMAL HIGH (ref 4.8–5.6)

## 2022-08-16 MED ORDER — NOVOLIN 70/30 RELION (70-30) 100 UNIT/ML ~~LOC~~ SUSP: SUBCUTANEOUS | 6 refills | Status: DC

## 2022-08-16 MED ORDER — METFORMIN HCL 1000 MG PO TABS: 1000.0000 mg | ORAL_TABLET | Freq: Two times a day (BID) | ORAL | 4 refills | Status: DC

## 2022-08-16 NOTE — Progress Notes (Signed)
Established Patient Office Visit  Subjective:  Patient ID: Alice Perez, female    DOB: 23-Aug-1965  Age: 57 y.o. MRN: [id number]  Chief Complaint  Patient presents with   Follow-up    3 months follow up    Patient in office for 3 month follow up, discuss lab results. Patient's son is present in the room and acts as the interpretor. Has no complaints today. Reports she has not been taking insulin, glipizide or metformin.  Does not follow a diabetic diet, eats what she wants.  Patient does not check blood sugar at home.  Hgb A1c elevated. Restart insulin and metformin, prescriptions sent to the pharmacy. Samples of Farxiga given to patient. Check blood sugars daily. Handout for diabetic diet given to patient in spanish.     No other concerns at this time.   Past Medical History:  Diagnosis Date   Diabetes mellitus without complication (HCC)    Hypertension     Past Surgical History:  Procedure Laterality Date   CESAREAN SECTION     x3   CHOLECYSTECTOMY     TUBAL LIGATION      Social History   Socioeconomic History   Marital status: Married    Spouse name: Not on file   Number of children: Not on file   Years of education: Not on file   Highest education level: Not on file  Occupational History   Not on file  Tobacco Use   Smoking status: Never   Smokeless tobacco: Never  Substance and Sexual Activity   Alcohol use: Yes    Comment: ocassionally   Drug use: No   Sexual activity: Not on file  Other Topics Concern   Not on file  Social History Narrative   Not on file   Social Determinants of Health   Financial Resource Strain: Not on file  Food Insecurity: Not on file  Transportation Needs: Not on file  Physical Activity: Not on file  Stress: Not on file  Social Connections: Not on file  Intimate Partner Violence: Not on file    Family History  Problem Relation Age of Onset   Hypertension Mother    Diabetes Father    Heart attack Father     Cancer Brother     No Known Allergies  Review of Systems  Constitutional: Negative.  Negative for chills, fever, malaise/fatigue and weight loss.  HENT: Negative.  Negative for sinus pain and sore throat.   Eyes: Negative.   Respiratory: Negative.  Negative for cough, shortness of breath and stridor.   Cardiovascular: Negative.  Negative for chest pain, palpitations and leg swelling.  Gastrointestinal: Negative.  Negative for abdominal pain, constipation, diarrhea, heartburn, nausea and vomiting.  Genitourinary: Negative.  Negative for dysuria and flank pain.  Musculoskeletal: Negative.  Negative for joint pain and myalgias.  Skin: Negative.   Neurological: Negative.  Negative for dizziness, tingling and headaches.  Endo/Heme/Allergies: Negative.   Psychiatric/Behavioral: Negative.  Negative for depression and suicidal ideas. The patient is not nervous/anxious.        Objective:   BP 112/66   Pulse 81   Ht 5' (1.524 m)   Wt 134 lb (60.8 kg)   LMP 03/04/2016 (Approximate)   SpO2 96%   BMI 26.17 kg/m   Vitals:   08/16/22 1159  BP: 112/66  Pulse: 81  Height: 5' (1.524 m)  Weight: 134 lb (60.8 kg)  SpO2: 96%  BMI (Calculated): 26.17    Physical Exam Vitals and  nursing note reviewed.  Constitutional:      General: She is not in acute distress.    Appearance: Normal appearance.  HENT:     Head: Normocephalic and atraumatic.     Nose: Nose normal.     Mouth/Throat:     Mouth: Mucous membranes are moist.     Pharynx: Oropharynx is clear.  Eyes:     Conjunctiva/sclera: Conjunctivae normal.     Pupils: Pupils are equal, round, and reactive to light.  Cardiovascular:     Rate and Rhythm: Normal rate and regular rhythm.     Pulses: Normal pulses.     Heart sounds: Normal heart sounds. No murmur heard. Pulmonary:     Effort: Pulmonary effort is normal.     Breath sounds: Normal breath sounds. No wheezing.  Abdominal:     General: Bowel sounds are normal.      Palpations: Abdomen is soft.     Tenderness: There is no abdominal tenderness. There is no right CVA tenderness or left CVA tenderness.  Musculoskeletal:        General: Normal range of motion.     Cervical back: Normal range of motion.     Right lower leg: No edema.     Left lower leg: No edema.  Skin:    General: Skin is warm and dry.  Neurological:     General: No focal deficit present.     Mental Status: She is alert and oriented to person, place, and time.  Psychiatric:        Mood and Affect: Mood normal.        Behavior: Behavior normal.      No results found for any visits on 08/16/22.  Recent Results (from the past 2160 hour(s))  CMP14+EGFR     Status: Abnormal   Collection Time: 08/15/22  9:44 AM  Result Value Ref Range   Glucose 307 (H) 70 - 99 mg/dL   BUN 16 6 - 24 mg/dL   Creatinine, Ser 1.61 (L) 0.57 - 1.00 mg/dL   eGFR 096 >04 VW/UJW/1.19   BUN/Creatinine Ratio 29 (H) 9 - 23   Sodium 136 134 - 144 mmol/L   Potassium 4.5 3.5 - 5.2 mmol/L   Chloride 100 96 - 106 mmol/L   CO2 23 20 - 29 mmol/L   Calcium 9.7 8.7 - 10.2 mg/dL   Total Protein 7.3 6.0 - 8.5 g/dL   Albumin 4.1 3.8 - 4.9 g/dL   Globulin, Total 3.2 1.5 - 4.5 g/dL   Bilirubin Total 0.5 0.0 - 1.2 mg/dL   Alkaline Phosphatase 171 (H) 44 - 121 IU/L   AST 19 0 - 40 IU/L   ALT 20 0 - 32 IU/L  CBC With Differential     Status: None   Collection Time: 08/15/22  9:44 AM  Result Value Ref Range   WBC 6.8 3.4 - 10.8 x10E3/uL   RBC 4.64 3.77 - 5.28 x10E6/uL   Hemoglobin 13.1 11.1 - 15.9 g/dL   Hematocrit 14.7 82.9 - 46.6 %   MCV 86 79 - 97 fL   MCH 28.2 26.6 - 33.0 pg   MCHC 33.0 31.5 - 35.7 g/dL   RDW 56.2 13.0 - 86.5 %   Neutrophils 53 Not Estab. %   Lymphs 37 Not Estab. %   Monocytes 8 Not Estab. %   Eos 2 Not Estab. %   Basos 0 Not Estab. %   Neutrophils Absolute 3.6 1.4 - 7.0 x10E3/uL  Lymphocytes Absolute 2.5 0.7 - 3.1 x10E3/uL   Monocytes Absolute 0.5 0.1 - 0.9 x10E3/uL   EOS (ABSOLUTE)  0.1 0.0 - 0.4 x10E3/uL   Basophils Absolute 0.0 0.0 - 0.2 x10E3/uL   Immature Granulocytes 0 Not Estab. %   Immature Grans (Abs) 0.0 0.0 - 0.1 x10E3/uL    Comment: **Effective September 16, 2022, profile [id number] CBC/Differential**   (No Platelet) will be made non-orderable. [facility] Offers:   [id number] CBC With Differential/Platelet   Lipid Panel w/o Chol/HDL Ratio     Status: None   Collection Time: 08/15/22  9:44 AM  Result Value Ref Range   Cholesterol, Total 135 100 - 199 mg/dL   Triglycerides 045 0 - 149 mg/dL   HDL 46 >40 mg/dL   VLDL Cholesterol Cal 22 5 - 40 mg/dL   LDL Chol Calc (NIH) 67 0 - 99 mg/dL  Hemoglobin J8J     Status: Abnormal   Collection Time: 08/15/22  9:44 AM  Result Value Ref Range   Hgb A1c MFr Bld 11.3 (H) 4.8 - 5.6 %    Comment:          Prediabetes: 5.7 - 6.4          Diabetes: >6.4          Glycemic control for adults with diabetes: <7.0    Est. average glucose Bld gHb Est-mCnc 278 mg/dL      Assessment & Plan:  Restart Insulin and metformin, start [place]. Check blood sugars daily. Follow a strict diabetic diet.   Problem List Items Addressed This Visit     Type 2 diabetes mellitus with complication (HCC) - Primary   Relevant Medications   insulin NPH-regular Human (NOVOLIN 70/30 RELION) (70-30) 100 UNIT/ML injection   metFORMIN (GLUCOPHAGE) 1000 MG tablet   dapagliflozin propanediol (FARXIGA) 5 MG TABS tablet   Hyperlipidemia   Essential hypertension, benign    Return in about 2 weeks (around 08/30/2022).   Total time spent: 25 minutes  Margaretann Loveless, MD  08/16/2022   This document may have been prepared by [facility] Voice Recognition software and as such may include unintentional dictation errors.

## 2022-09-06 ENCOUNTER — Encounter: Payer: Self-pay | Admitting: Cardiology

## 2022-09-06 ENCOUNTER — Ambulatory Visit: Payer: Self-pay | Admitting: Cardiology

## 2022-09-06 VITALS — BP 130/80 | HR 71 | Ht 63.0 in | Wt 141.6 lb

## 2022-09-06 DIAGNOSIS — E118 Type 2 diabetes mellitus with unspecified complications: Secondary | ICD-10-CM

## 2022-09-06 LAB — POCT CBG (FASTING - GLUCOSE)-MANUAL ENTRY: Glucose Fasting, POC: 106 mg/dL — AB (ref 70–99)

## 2022-09-06 MED ORDER — ROSUVASTATIN CALCIUM 20 MG PO TABS: 20.0000 mg | ORAL_TABLET | Freq: Every day | ORAL | 5 refills | Status: DC

## 2022-09-06 MED ORDER — GLIPIZIDE 10 MG PO TABS: 10.0000 mg | ORAL_TABLET | Freq: Every day | ORAL | 5 refills | Status: DC

## 2022-09-06 MED ORDER — LOSARTAN POTASSIUM 50 MG PO TABS: 50.0000 mg | ORAL_TABLET | Freq: Every day | ORAL | 5 refills | Status: DC

## 2022-09-06 NOTE — Progress Notes (Signed)
Established Patient Office Visit  Subjective:  Patient ID: Alice Perez, female    DOB: Jun 27, 1965  Age: 57 y.o. MRN: [id number]  Chief Complaint  Patient presents with   Follow-up    2 week follow up    Patient in office for 2 week follow up. Patient's son is present in the room and acts as the interpretor. Patient restarted on all diabetes medications at last office visit, insulin, Farxiga and Glipizide. Patient feeling well, no complaints. Blood sugar today 106. Will continue with same regimen.     No other concerns at this time.   Past Medical History:  Diagnosis Date   Diabetes mellitus without complication (HCC)    Hypertension     Past Surgical History:  Procedure Laterality Date   CESAREAN SECTION     x3   CHOLECYSTECTOMY     TUBAL LIGATION      Social History   Socioeconomic History   Marital status: Married    Spouse name: Not on file   Number of children: Not on file   Years of education: Not on file   Highest education level: Not on file  Occupational History   Not on file  Tobacco Use   Smoking status: Never   Smokeless tobacco: Never  Substance and Sexual Activity   Alcohol use: Yes    Comment: ocassionally   Drug use: No   Sexual activity: Not on file  Other Topics Concern   Not on file  Social History Narrative   Not on file   Social Determinants of Health   Financial Resource Strain: Not on file  Food Insecurity: Not on file  Transportation Needs: Not on file  Physical Activity: Not on file  Stress: Not on file  Social Connections: Not on file  Intimate Partner Violence: Not on file    Family History  Problem Relation Age of Onset   Hypertension Mother    Diabetes Father    Heart attack Father    Cancer Brother     No Known Allergies  Review of Systems  Constitutional: Negative.   HENT: Negative.    Eyes: Negative.   Respiratory: Negative.  Negative for shortness of breath.   Cardiovascular: Negative.  Negative  for chest pain.  Gastrointestinal: Negative.  Negative for abdominal pain, constipation and diarrhea.  Genitourinary: Negative.   Musculoskeletal:  Negative for joint pain and myalgias.  Skin: Negative.   Neurological: Negative.  Negative for dizziness and headaches.  Endo/Heme/Allergies: Negative.   All other systems reviewed and are negative.      Objective:   BP 130/80   Pulse 71   Ht 5\' 3"  (1.6 m)   Wt 141 lb 9.6 oz (64.2 kg)   LMP 03/04/2016 (Approximate)   SpO2 96%   BMI 25.08 kg/m   Vitals:   09/06/22 1121  BP: 130/80  Pulse: 71  Height: 5\' 3"  (1.6 m)  Weight: 141 lb 9.6 oz (64.2 kg)  SpO2: 96%  BMI (Calculated): 25.09    Physical Exam Vitals and nursing note reviewed.  Constitutional:      Appearance: Normal appearance. She is normal weight.  HENT:     Head: Normocephalic and atraumatic.     Nose: Nose normal.     Mouth/Throat:     Mouth: Mucous membranes are moist.  Eyes:     Extraocular Movements: Extraocular movements intact.     Conjunctiva/sclera: Conjunctivae normal.     Pupils: Pupils are equal, round, and reactive  to light.  Cardiovascular:     Rate and Rhythm: Normal rate and regular rhythm.     Pulses: Normal pulses.     Heart sounds: Normal heart sounds.  Pulmonary:     Effort: Pulmonary effort is normal.     Breath sounds: Normal breath sounds.  Abdominal:     General: Abdomen is flat. Bowel sounds are normal.     Palpations: Abdomen is soft.  Musculoskeletal:        General: Normal range of motion.     Cervical back: Normal range of motion.  Skin:    General: Skin is warm and dry.  Neurological:     General: No focal deficit present.     Mental Status: She is alert and oriented to person, place, and time.  Psychiatric:        Mood and Affect: Mood normal.        Behavior: Behavior normal.        Thought Content: Thought content normal.        Judgment: Judgment normal.      Results for orders placed or performed in visit on  09/06/22  POCT CBG (Fasting - Glucose)  Result Value Ref Range   Glucose Fasting, POC 106 (A) 70 - 99 mg/dL    Recent Results (from the past 2160 hour(s))  CMP14+EGFR     Status: Abnormal   Collection Time: 08/15/22  9:44 AM  Result Value Ref Range   Glucose 307 (H) 70 - 99 mg/dL   BUN 16 6 - 24 mg/dL   Creatinine, Ser 1.91 (L) 0.57 - 1.00 mg/dL   eGFR 478 >29 FA/OZH/0.86   BUN/Creatinine Ratio 29 (H) 9 - 23   Sodium 136 134 - 144 mmol/L   Potassium 4.5 3.5 - 5.2 mmol/L   Chloride 100 96 - 106 mmol/L   CO2 23 20 - 29 mmol/L   Calcium 9.7 8.7 - 10.2 mg/dL   Total Protein 7.3 6.0 - 8.5 g/dL   Albumin 4.1 3.8 - 4.9 g/dL   Globulin, Total 3.2 1.5 - 4.5 g/dL   Bilirubin Total 0.5 0.0 - 1.2 mg/dL   Alkaline Phosphatase 171 (H) 44 - 121 IU/L   AST 19 0 - 40 IU/L   ALT 20 0 - 32 IU/L  CBC With Differential     Status: None   Collection Time: 08/15/22  9:44 AM  Result Value Ref Range   WBC 6.8 3.4 - 10.8 x10E3/uL   RBC 4.64 3.77 - 5.28 x10E6/uL   Hemoglobin 13.1 11.1 - 15.9 g/dL   Hematocrit 57.8 46.9 - 46.6 %   MCV 86 79 - 97 fL   MCH 28.2 26.6 - 33.0 pg   MCHC 33.0 31.5 - 35.7 g/dL   RDW 62.9 52.8 - 41.3 %   Neutrophils 53 Not Estab. %   Lymphs 37 Not Estab. %   Monocytes 8 Not Estab. %   Eos 2 Not Estab. %   Basos 0 Not Estab. %   Neutrophils Absolute 3.6 1.4 - 7.0 x10E3/uL   Lymphocytes Absolute 2.5 0.7 - 3.1 x10E3/uL   Monocytes Absolute 0.5 0.1 - 0.9 x10E3/uL   EOS (ABSOLUTE) 0.1 0.0 - 0.4 x10E3/uL   Basophils Absolute 0.0 0.0 - 0.2 x10E3/uL   Immature Granulocytes 0 Not Estab. %   Immature Grans (Abs) 0.0 0.0 - 0.1 x10E3/uL    Comment: **Effective September 16, 2022, profile [id number] CBC/Differential**   (No Platelet) will be made non-orderable. [facility] Offers:  [id number] CBC With Differential/Platelet   Lipid Panel w/o Chol/HDL Ratio     Status: None   Collection Time: 08/15/22  9:44 AM  Result Value Ref Range   Cholesterol, Total 135 100 - 199 mg/dL   Triglycerides  147 0 - 149 mg/dL   HDL 46 >82 mg/dL   VLDL Cholesterol Cal 22 5 - 40 mg/dL   LDL Chol Calc (NIH) 67 0 - 99 mg/dL  Hemoglobin N5A     Status: Abnormal   Collection Time: 08/15/22  9:44 AM  Result Value Ref Range   Hgb A1c MFr Bld 11.3 (H) 4.8 - 5.6 %    Comment:          Prediabetes: 5.7 - 6.4          Diabetes: >6.4          Glycemic control for adults with diabetes: <7.0    Est. average glucose Bld gHb Est-mCnc 278 mg/dL  POCT CBG (Fasting - Glucose)     Status: Abnormal   Collection Time: 09/06/22 11:32 AM  Result Value Ref Range   Glucose Fasting, POC 106 (A) 70 - 99 mg/dL      Assessment & Plan:  Patient taking and tolerating medications. Continue same regimen.  2.   Check blood sugars at home.   Problem List Items Addressed This Visit       Endocrine   Type 2 diabetes mellitus with complication (HCC) - Primary   Relevant Medications   rosuvastatin (CRESTOR) 20 MG tablet   glipiZIDE (GLUCOTROL) 10 MG tablet   Other Relevant Orders   POCT CBG (Fasting - Glucose) (Completed)    No follow-ups on file.   Total time spent: 25 minutes  [employer], NP  09/06/2022   This document may have been prepared by Dragon Voice Recognition software and as such may include unintentional dictation errors.

## 2022-09-10 ENCOUNTER — Telehealth: Payer: Self-pay | Admitting: Nurse Practitioner

## 2022-09-10 NOTE — Telephone Encounter (Signed)
Amanda at [facility] in [place] left VM that patient has been on glipizide ER but Amber prescribed regular glipizide for patient. Did you mean to make this change?

## 2022-09-12 ENCOUNTER — Other Ambulatory Visit: Payer: Self-pay

## 2022-09-12 MED ORDER — GLIPIZIDE ER 10 MG PO TB24: 10.0000 mg | ORAL_TABLET | Freq: Every day | ORAL | 5 refills | Status: DC

## 2022-09-12 NOTE — Telephone Encounter (Signed)
Pharmacy states that she's supposed to be taking the ER not regular glipizide

## 2022-09-18 ENCOUNTER — Telehealth: Payer: Self-pay

## 2022-09-19 NOTE — Telephone Encounter (Signed)
Spoke with pharmacy to give verbal update on prescription and pharmacy verbalized understanding.

## 2022-10-04 ENCOUNTER — Ambulatory Visit: Payer: Self-pay | Admitting: Cardiology

## 2022-10-16 ENCOUNTER — Ambulatory Visit: Payer: Self-pay | Admitting: Cardiology

## 2022-10-25 ENCOUNTER — Other Ambulatory Visit: Payer: Self-pay

## 2022-10-25 DIAGNOSIS — E782 Mixed hyperlipidemia: Secondary | ICD-10-CM

## 2022-10-25 DIAGNOSIS — E118 Type 2 diabetes mellitus with unspecified complications: Secondary | ICD-10-CM

## 2022-10-25 DIAGNOSIS — I10 Essential (primary) hypertension: Secondary | ICD-10-CM

## 2022-10-28 ENCOUNTER — Other Ambulatory Visit: Payer: Self-pay

## 2022-10-28 DIAGNOSIS — I10 Essential (primary) hypertension: Secondary | ICD-10-CM

## 2022-10-28 DIAGNOSIS — E118 Type 2 diabetes mellitus with unspecified complications: Secondary | ICD-10-CM

## 2022-10-29 ENCOUNTER — Ambulatory Visit: Payer: Self-pay | Admitting: Cardiology

## 2022-10-29 ENCOUNTER — Encounter: Payer: Self-pay | Admitting: Cardiology

## 2022-10-29 VITALS — BP 110/70 | HR 70 | Ht 63.0 in | Wt 144.0 lb

## 2022-10-29 DIAGNOSIS — E118 Type 2 diabetes mellitus with unspecified complications: Secondary | ICD-10-CM

## 2022-10-29 DIAGNOSIS — E782 Mixed hyperlipidemia: Secondary | ICD-10-CM

## 2022-10-29 DIAGNOSIS — I10 Essential (primary) hypertension: Secondary | ICD-10-CM

## 2022-10-29 LAB — CMP14+EGFR
ALT: 21 IU/L (ref 0–32)
AST: 18 IU/L (ref 0–40)
Albumin: 4.2 g/dL (ref 3.8–4.9)
Alkaline Phosphatase: 179 IU/L — ABNORMAL HIGH (ref 44–121)
BUN/Creatinine Ratio: 32 — ABNORMAL HIGH (ref 9–23)
BUN: 19 mg/dL (ref 6–24)
Bilirubin Total: 0.4 mg/dL (ref 0.0–1.2)
CO2: 22 mmol/L (ref 20–29)
Calcium: 9.8 mg/dL (ref 8.7–10.2)
Chloride: 102 mmol/L (ref 96–106)
Creatinine, Ser: 0.59 mg/dL (ref 0.57–1.00)
Globulin, Total: 3.5 g/dL (ref 1.5–4.5)
Glucose: 252 mg/dL — ABNORMAL HIGH (ref 70–99)
Potassium: 4.4 mmol/L (ref 3.5–5.2)
Sodium: 138 mmol/L (ref 134–144)
Total Protein: 7.7 g/dL (ref 6.0–8.5)
eGFR: 107 mL/min/{1.73_m2} (ref >59)

## 2022-10-29 LAB — HEMOGLOBIN A1C
Est. average glucose Bld gHb Est-mCnc: 266 mg/dL
Hgb A1c MFr Bld: 10.9 % — ABNORMAL HIGH (ref 4.8–5.6)

## 2022-10-29 MED ORDER — BLOOD GLUCOSE TEST STRIPS 333 VI STRP: ORAL_STRIP | 4 refills | Status: AC

## 2022-10-29 NOTE — Progress Notes (Signed)
Established Patient Office Visit  Subjective:  Patient ID: Alice Perez, female    DOB: November 07, 1965  Age: 57 y.o. MRN: [id number]  No chief complaint on file.   Patient in office for 4 week follow up. Patient's son is present in the room and acts as the interpretor. Patient reports feeling well, no complaints today.  Discussed recent lab work. Hgb A1c improved. Patient previously noncompliant with medications, restarted all DM medications end of June 2024. Has not been three months since restarting medications.  Diabetic son states patient is eating and drinking similar to him. Some lab results not resulted, will call patient with results.     No other concerns at this time.   Past Medical History:  Diagnosis Date   Diabetes mellitus without complication (HCC)    Hypertension     Past Surgical History:  Procedure Laterality Date   CESAREAN SECTION     x3   CHOLECYSTECTOMY     TUBAL LIGATION      Social History   Socioeconomic History   Marital status: Married    Spouse name: Not on file   Number of children: Not on file   Years of education: Not on file   Highest education level: Not on file  Occupational History   Not on file  Tobacco Use   Smoking status: Never   Smokeless tobacco: Never  Substance and Sexual Activity   Alcohol use: Yes    Comment: ocassionally   Drug use: No   Sexual activity: Not on file  Other Topics Concern   Not on file  Social History Narrative   Not on file   Social Determinants of Health   Financial Resource Strain: Not on file  Food Insecurity: Not on file  Transportation Needs: Not on file  Physical Activity: Not on file  Stress: Not on file  Social Connections: Not on file  Intimate Partner Violence: Not on file    Family History  Problem Relation Age of Onset   Hypertension Mother    Diabetes Father    Heart attack Father    Cancer Brother     No Known Allergies  Review of Systems  Constitutional:  Negative.   HENT: Negative.    Eyes: Negative.   Respiratory: Negative.  Negative for shortness of breath.   Cardiovascular: Negative.  Negative for chest pain.  Gastrointestinal: Negative.  Negative for abdominal pain, constipation and diarrhea.  Genitourinary: Negative.   Musculoskeletal:  Negative for joint pain and myalgias.  Skin: Negative.   Neurological: Negative.  Negative for dizziness and headaches.  Endo/Heme/Allergies: Negative.   All other systems reviewed and are negative.      Objective:   BP 110/70   Pulse 70   Ht 5\' 3"  (1.6 m)   Wt 144 lb (65.3 kg)   LMP 03/04/2016 (Approximate)   SpO2 98%   BMI 25.51 kg/m   Vitals:   10/29/22 1313  BP: 110/70  Pulse: 70  Height: 5\' 3"  (1.6 m)  Weight: 144 lb (65.3 kg)  SpO2: 98%  BMI (Calculated): 25.51    Physical Exam Vitals and nursing note reviewed.  Constitutional:      Appearance: Normal appearance. She is normal weight.  HENT:     Head: Normocephalic and atraumatic.     Nose: Nose normal.     Mouth/Throat:     Mouth: Mucous membranes are moist.  Eyes:     Extraocular Movements: Extraocular movements intact.  Conjunctiva/sclera: Conjunctivae normal.     Pupils: Pupils are equal, round, and reactive to light.  Cardiovascular:     Rate and Rhythm: Normal rate and regular rhythm.     Pulses: Normal pulses.     Heart sounds: Normal heart sounds.  Pulmonary:     Effort: Pulmonary effort is normal.     Breath sounds: Normal breath sounds.  Abdominal:     General: Abdomen is flat. Bowel sounds are normal.     Palpations: Abdomen is soft.  Musculoskeletal:        General: Normal range of motion.     Cervical back: Normal range of motion.  Skin:    General: Skin is warm and dry.  Neurological:     General: No focal deficit present.     Mental Status: She is alert and oriented to person, place, and time.  Psychiatric:        Mood and Affect: Mood normal.        Behavior: Behavior normal.         Thought Content: Thought content normal.        Judgment: Judgment normal.      No results found for any visits on 10/29/22.  Recent Results (from the past 2160 hour(s))  CMP14+EGFR     Status: Abnormal   Collection Time: 08/15/22  9:44 AM  Result Value Ref Range   Glucose 307 (H) 70 - 99 mg/dL   BUN 16 6 - 24 mg/dL   Creatinine, Ser 6.23 (L) 0.57 - 1.00 mg/dL   eGFR 762 >83 TD/VVO/1.60   BUN/Creatinine Ratio 29 (H) 9 - 23   Sodium 136 134 - 144 mmol/L   Potassium 4.5 3.5 - 5.2 mmol/L   Chloride 100 96 - 106 mmol/L   CO2 23 20 - 29 mmol/L   Calcium 9.7 8.7 - 10.2 mg/dL   Total Protein 7.3 6.0 - 8.5 g/dL   Albumin 4.1 3.8 - 4.9 g/dL   Globulin, Total 3.2 1.5 - 4.5 g/dL   Bilirubin Total 0.5 0.0 - 1.2 mg/dL   Alkaline Phosphatase 171 (H) 44 - 121 IU/L   AST 19 0 - 40 IU/L   ALT 20 0 - 32 IU/L  CBC With Differential     Status: None   Collection Time: 08/15/22  9:44 AM  Result Value Ref Range   WBC 6.8 3.4 - 10.8 x10E3/uL   RBC 4.64 3.77 - 5.28 x10E6/uL   Hemoglobin 13.1 11.1 - 15.9 g/dL   Hematocrit 73.7 10.6 - 46.6 %   MCV 86 79 - 97 fL   MCH 28.2 26.6 - 33.0 pg   MCHC 33.0 31.5 - 35.7 g/dL   RDW 26.9 48.5 - 46.2 %   Neutrophils 53 Not Estab. %   Lymphs 37 Not Estab. %   Monocytes 8 Not Estab. %   Eos 2 Not Estab. %   Basos 0 Not Estab. %   Neutrophils Absolute 3.6 1.4 - 7.0 x10E3/uL   Lymphocytes Absolute 2.5 0.7 - 3.1 x10E3/uL   Monocytes Absolute 0.5 0.1 - 0.9 x10E3/uL   EOS (ABSOLUTE) 0.1 0.0 - 0.4 x10E3/uL   Basophils Absolute 0.0 0.0 - 0.2 x10E3/uL   Immature Granulocytes 0 Not Estab. %   Immature Grans (Abs) 0.0 0.0 - 0.1 x10E3/uL    Comment: **Effective September 16, 2022, profile [id number] CBC/Differential**   (No Platelet) will be made non-orderable. [facility] Offers:   [id number] CBC With Differential/Platelet   Lipid Panel w/o Chol/HDL  Ratio     Status: None   Collection Time: 08/15/22  9:44 AM  Result Value Ref Range   Cholesterol, Total 135 100 - 199 mg/dL    Triglycerides 161 0 - 149 mg/dL   HDL 46 >09 mg/dL   VLDL Cholesterol Cal 22 5 - 40 mg/dL   LDL Chol Calc (NIH) 67 0 - 99 mg/dL  Hemoglobin U0A     Status: Abnormal   Collection Time: 08/15/22  9:44 AM  Result Value Ref Range   Hgb A1c MFr Bld 11.3 (H) 4.8 - 5.6 %    Comment:          Prediabetes: 5.7 - 6.4          Diabetes: >6.4          Glycemic control for adults with diabetes: <7.0    Est. average glucose Bld gHb Est-mCnc 278 mg/dL  POCT CBG (Fasting - Glucose)     Status: Abnormal   Collection Time: 09/06/22 11:32 AM  Result Value Ref Range   Glucose Fasting, POC 106 (A) 70 - 99 mg/dL  Hemoglobin V4U     Status: Abnormal   Collection Time: 10/28/22  9:19 AM  Result Value Ref Range   Hgb A1c MFr Bld 10.9 (H) 4.8 - 5.6 %    Comment:          Prediabetes: 5.7 - 6.4          Diabetes: >6.4          Glycemic control for adults with diabetes: <7.0    Est. average glucose Bld gHb Est-mCnc 266 mg/dL  [id number]     Status: Abnormal   Collection Time: 10/28/22  9:19 AM  Result Value Ref Range   Glucose 252 (H) 70 - 99 mg/dL   BUN 19 6 - 24 mg/dL   Creatinine, Ser 2.95 0.57 - 1.00 mg/dL   eGFR 621 >30 QM/VHQ/4.69   BUN/Creatinine Ratio 32 (H) 9 - 23   Sodium 138 134 - 144 mmol/L   Potassium 4.4 3.5 - 5.2 mmol/L   Chloride 102 96 - 106 mmol/L   CO2 22 20 - 29 mmol/L   Calcium 9.8 8.7 - 10.2 mg/dL   Total Protein 7.7 6.0 - 8.5 g/dL   Albumin 4.2 3.8 - 4.9 g/dL   Globulin, Total 3.5 1.5 - 4.5 g/dL   Bilirubin Total 0.4 0.0 - 1.2 mg/dL   Alkaline Phosphatase 179 (H) 44 - 121 IU/L   AST 18 0 - 40 IU/L   ALT 21 0 - 32 IU/L      Assessment & Plan:  Continue all medications.  Continue to work on diabetic diet.  Will call with remainder of lab results.   Problem List Items Addressed This Visit       Cardiovascular and Mediastinum   Essential hypertension, benign - Primary     Endocrine   Type 2 diabetes mellitus with complication (HCC)     Other   Hyperlipidemia     Return in about 3 months (around 01/28/2023) for with fasting labs prior.   Total time spent: 25 minutes  Google, NP  10/29/2022   This document may have been prepared by Dragon Voice Recognition software and as such may include unintentional dictation errors.

## 2023-01-24 ENCOUNTER — Other Ambulatory Visit: Payer: Self-pay | Admitting: Cardiology

## 2023-01-24 ENCOUNTER — Other Ambulatory Visit: Payer: Self-pay

## 2023-01-24 DIAGNOSIS — I10 Essential (primary) hypertension: Secondary | ICD-10-CM

## 2023-01-24 DIAGNOSIS — E782 Mixed hyperlipidemia: Secondary | ICD-10-CM

## 2023-01-24 DIAGNOSIS — E118 Type 2 diabetes mellitus with unspecified complications: Secondary | ICD-10-CM

## 2023-01-25 LAB — HEMOGLOBIN A1C
Est. average glucose Bld gHb Est-mCnc: 321 mg/dL
Hgb A1c MFr Bld: 12.8 % — ABNORMAL HIGH (ref 4.8–5.6)

## 2023-01-25 LAB — LIPID PANEL
Chol/HDL Ratio: 2.8 {ratio} (ref 0.0–4.4)
Cholesterol, Total: 135 mg/dL (ref 100–199)
HDL: 48 mg/dL (ref >39)
LDL Chol Calc (NIH): 65 mg/dL (ref 0–99)
Triglycerides: 125 mg/dL (ref 0–149)
VLDL Cholesterol Cal: 22 mg/dL (ref 5–40)

## 2023-01-25 LAB — CMP14+EGFR
ALT: 25 [IU]/L (ref 0–32)
AST: 23 [IU]/L (ref 0–40)
Albumin: 3.9 g/dL (ref 3.8–4.9)
Alkaline Phosphatase: 205 [IU]/L — ABNORMAL HIGH (ref 44–121)
BUN/Creatinine Ratio: 28 — ABNORMAL HIGH (ref 9–23)
BUN: 16 mg/dL (ref 6–24)
Bilirubin Total: 0.4 mg/dL (ref 0.0–1.2)
CO2: 22 mmol/L (ref 20–29)
Calcium: 9.6 mg/dL (ref 8.7–10.2)
Chloride: 99 mmol/L (ref 96–106)
Creatinine, Ser: 0.58 mg/dL (ref 0.57–1.00)
Globulin, Total: 2.9 g/dL (ref 1.5–4.5)
Glucose: 288 mg/dL — ABNORMAL HIGH (ref 70–99)
Potassium: 4.2 mmol/L (ref 3.5–5.2)
Sodium: 135 mmol/L (ref 134–144)
Total Protein: 6.8 g/dL (ref 6.0–8.5)
eGFR: 107 mL/min/{1.73_m2} (ref >59)

## 2023-01-28 ENCOUNTER — Ambulatory Visit: Payer: Self-pay | Admitting: Cardiology

## 2023-01-28 ENCOUNTER — Encounter: Payer: Self-pay | Admitting: Cardiology

## 2023-01-28 VITALS — BP 130/76 | HR 74 | Wt 149.6 lb

## 2023-01-28 DIAGNOSIS — E118 Type 2 diabetes mellitus with unspecified complications: Secondary | ICD-10-CM

## 2023-01-28 DIAGNOSIS — E782 Mixed hyperlipidemia: Secondary | ICD-10-CM

## 2023-01-28 DIAGNOSIS — I10 Essential (primary) hypertension: Secondary | ICD-10-CM

## 2023-01-28 LAB — POCT CBG (FASTING - GLUCOSE)-MANUAL ENTRY: Glucose Fasting, POC: 277 mg/dL — AB (ref 70–99)

## 2023-01-28 MED ORDER — METFORMIN HCL 1000 MG PO TABS: 1000.0000 mg | ORAL_TABLET | Freq: Two times a day (BID) | ORAL | 4 refills | Status: DC

## 2023-01-28 NOTE — Progress Notes (Signed)
Established Patient Office Visit  Subjective:  Patient ID: Alice Perez, female    DOB: 1965/04/05  Age: 57 y.o. MRN: [id number]  Chief Complaint  Patient presents with   Follow-up    Patient in office for 3 month follow up, discuss recent lab results. Patient's son is present in the room and acts as the interpretor. vPatient has not been taking [place] due to cost issues. Hgb A1c is elevated. Will change Farxiga to metformin. Son also states patient has not been taking her insulin twice daily, only once daily. LDL at goal.     No other concerns at this time.   Past Medical History:  Diagnosis Date   Diabetes mellitus without complication (HCC)    Hypertension     Past Surgical History:  Procedure Laterality Date   CESAREAN SECTION     x3   CHOLECYSTECTOMY     TUBAL LIGATION      Social History   Socioeconomic History   Marital status: Married    Spouse name: Not on file   Number of children: Not on file   Years of education: Not on file   Highest education level: Not on file  Occupational History   Not on file  Tobacco Use   Smoking status: Never   Smokeless tobacco: Never  Substance and Sexual Activity   Alcohol use: Yes    Comment: ocassionally   Drug use: No   Sexual activity: Not on file  Other Topics Concern   Not on file  Social History Narrative   Not on file   Social Determinants of Health   Financial Resource Strain: Not on file  Food Insecurity: Not on file  Transportation Needs: Not on file  Physical Activity: Not on file  Stress: Not on file  Social Connections: Not on file  Intimate Partner Violence: Not on file    Family History  Problem Relation Age of Onset   Hypertension Mother    Diabetes Father    Heart attack Father    Cancer Brother     No Known Allergies  Outpatient Medications Prior to Visit  Medication Sig   glipiZIDE (GLUCOTROL XL) 10 MG 24 hr tablet Take 1 tablet (10 mg total) by mouth daily with breakfast.    Glucose Blood (BLOOD GLUCOSE TEST STRIPS 333) STRP As directed   insulin NPH-regular Human (NOVOLIN 70/30 RELION) (70-30) 100 UNIT/ML injection Inject 40 units SQ before breakfast and before evening meal.   Injecte 40 unidades subcutaneamente dos veces al dia con el alimento y antes de el alimento de la tarde   losartan (COZAAR) 50 MG tablet Take 1 tablet (50 mg total) by mouth daily. Tome una tableta por boca diaria   rosuvastatin (CRESTOR) 20 MG tablet Take 1 tablet (20 mg total) by mouth daily.   [DISCONTINUED] dapagliflozin propanediol (FARXIGA) 5 MG TABS tablet Take 5 mg by mouth daily.   No facility-administered medications prior to visit.    Review of Systems  Constitutional: Negative.   HENT: Negative.    Eyes: Negative.   Respiratory: Negative.  Negative for shortness of breath.   Cardiovascular: Negative.  Negative for chest pain.  Gastrointestinal: Negative.  Negative for abdominal pain, constipation and diarrhea.  Genitourinary: Negative.   Musculoskeletal:  Negative for joint pain and myalgias.  Skin: Negative.   Neurological: Negative.  Negative for dizziness and headaches.  Endo/Heme/Allergies: Negative.   All other systems reviewed and are negative.      Objective:  BP 130/76   Pulse 74   Wt 149 lb 9.6 oz (67.9 kg)   LMP 03/04/2016 (Approximate)   SpO2 98%   BMI 26.50 kg/m   Vitals:   01/28/23 1335  BP: 130/76  Pulse: 74  Weight: 149 lb 9.6 oz (67.9 kg)  SpO2: 98%    Physical Exam Vitals and nursing note reviewed.  Constitutional:      Appearance: Normal appearance. She is normal weight.  HENT:     Head: Normocephalic and atraumatic.     Nose: Nose normal.     Mouth/Throat:     Mouth: Mucous membranes are moist.  Eyes:     Extraocular Movements: Extraocular movements intact.     Conjunctiva/sclera: Conjunctivae normal.     Pupils: Pupils are equal, round, and reactive to light.  Cardiovascular:     Rate and Rhythm: Normal rate and regular  rhythm.     Pulses: Normal pulses.     Heart sounds: Normal heart sounds.  Pulmonary:     Effort: Pulmonary effort is normal.     Breath sounds: Normal breath sounds.  Abdominal:     General: Abdomen is flat. Bowel sounds are normal.     Palpations: Abdomen is soft.  Musculoskeletal:        General: Normal range of motion.     Cervical back: Normal range of motion.  Skin:    General: Skin is warm and dry.  Neurological:     General: No focal deficit present.     Mental Status: She is alert and oriented to person, place, and time.  Psychiatric:        Mood and Affect: Mood normal.        Behavior: Behavior normal.        Thought Content: Thought content normal.        Judgment: Judgment normal.      Results for orders placed or performed in visit on 01/28/23  POCT CBG (Fasting - Glucose)  Result Value Ref Range   Glucose Fasting, POC 277 (A) 70 - 99 mg/dL    Recent Results (from the past 2160 hour(s))  Lipid panel     Status: None   Collection Time: 01/24/23  9:36 AM  Result Value Ref Range   Cholesterol, Total 135 100 - 199 mg/dL   Triglycerides 956 0 - 149 mg/dL   HDL 48 >38 mg/dL   VLDL Cholesterol Cal 22 5 - 40 mg/dL   LDL Chol Calc (NIH) 65 0 - 99 mg/dL   Chol/HDL Ratio 2.8 0.0 - 4.4 ratio    Comment:                                   T. Chol/HDL Ratio                                             Men  Women                               1/2 Avg.Risk  3.4    3.3  Avg.Risk  5.0    4.4                                2X Avg.Risk  9.6    7.1                                3X Avg.Risk 23.4   11.0   CMP14+EGFR     Status: Abnormal   Collection Time: 01/24/23  9:36 AM  Result Value Ref Range   Glucose 288 (H) 70 - 99 mg/dL   BUN 16 6 - 24 mg/dL   Creatinine, Ser 6.96 0.57 - 1.00 mg/dL   eGFR 295 >28 UX/LKG/4.01   BUN/Creatinine Ratio 28 (H) 9 - 23   Sodium 135 134 - 144 mmol/L   Potassium 4.2 3.5 - 5.2 mmol/L   Chloride 99 96 -  106 mmol/L   CO2 22 20 - 29 mmol/L   Calcium 9.6 8.7 - 10.2 mg/dL   Total Protein 6.8 6.0 - 8.5 g/dL   Albumin 3.9 3.8 - 4.9 g/dL   Globulin, Total 2.9 1.5 - 4.5 g/dL   Bilirubin Total 0.4 0.0 - 1.2 mg/dL   Alkaline Phosphatase 205 (H) 44 - 121 IU/L   AST 23 0 - 40 IU/L   ALT 25 0 - 32 IU/L  Hemoglobin A1c     Status: Abnormal   Collection Time: 01/24/23  9:36 AM  Result Value Ref Range   Hgb A1c MFr Bld 12.8 (H) 4.8 - 5.6 %    Comment:          Prediabetes: 5.7 - 6.4          Diabetes: >6.4          Glycemic control for adults with diabetes: <7.0    Est. average glucose Bld gHb Est-mCnc 321 mg/dL  POCT CBG (Fasting - Glucose)     Status: Abnormal   Collection Time: 01/28/23  1:38 PM  Result Value Ref Range   Glucose Fasting, POC 277 (A) 70 - 99 mg/dL      Assessment & Plan:  Change Farxiga to metformin for better compliance.  Insulin should be twice daily.   Problem List Items Addressed This Visit       Cardiovascular and Mediastinum   Essential hypertension, benign     Endocrine   Type 2 diabetes mellitus with complication (HCC) - Primary   Relevant Medications   metFORMIN (GLUCOPHAGE) 1000 MG tablet   Other Relevant Orders   POCT CBG (Fasting - Glucose) (Completed)     Other   Hyperlipidemia    Return in about 4 weeks (around 02/25/2023).   Total time spent: 25 minutes  Google, NP  01/28/2023   This document may have been prepared by Dragon Voice Recognition software and as such may include unintentional dictation errors.

## 2023-02-27 ENCOUNTER — Ambulatory Visit: Payer: Self-pay | Admitting: Cardiology

## 2023-02-28 ENCOUNTER — Encounter: Payer: Self-pay | Admitting: Cardiology

## 2023-02-28 ENCOUNTER — Ambulatory Visit: Payer: Self-pay | Admitting: Cardiology

## 2023-02-28 VITALS — BP 138/58 | HR 82 | Ht 60.0 in | Wt 150.0 lb

## 2023-02-28 DIAGNOSIS — E118 Type 2 diabetes mellitus with unspecified complications: Secondary | ICD-10-CM

## 2023-02-28 LAB — POCT CBG (FASTING - GLUCOSE)-MANUAL ENTRY: Glucose Fasting, POC: 163 mg/dL — AB (ref 70–99)

## 2023-02-28 MED ORDER — METFORMIN HCL 1000 MG PO TABS: 1000.0000 mg | ORAL_TABLET | Freq: Two times a day (BID) | ORAL | 4 refills | Status: AC

## 2023-02-28 MED ORDER — NOVOLIN 70/30 RELION (70-30) 100 UNIT/ML ~~LOC~~ SUSP: SUBCUTANEOUS | 6 refills | Status: AC

## 2023-02-28 MED ORDER — GLIPIZIDE ER 10 MG PO TB24: 10.0000 mg | ORAL_TABLET | Freq: Every day | ORAL | 5 refills | Status: AC

## 2023-02-28 MED ORDER — LOSARTAN POTASSIUM 50 MG PO TABS: 50.0000 mg | ORAL_TABLET | Freq: Every day | ORAL | 5 refills | Status: AC

## 2023-02-28 MED ORDER — ROSUVASTATIN CALCIUM 20 MG PO TABS: 20.0000 mg | ORAL_TABLET | Freq: Every day | ORAL | 5 refills | Status: AC

## 2023-02-28 NOTE — Progress Notes (Signed)
 Established Patient Office Visit  Subjective:  Patient ID: Alice Perez, female    DOB: 1965-09-13  Age: 58 y.o. MRN: [id number]  Chief Complaint  Patient presents with   Follow-up    4 weeks follow up    Patient in office for one month follow up. Patient's son is present in the room and acts as the interpretor. Patient is self pay, was having difficulty getting Doreen due to cost. Doreen changed to metformin  at previous visit. Patient taking and tolerating metformin . Blood sugar today improved. Continue same medications.     No other concerns at this time.   Past Medical History:  Diagnosis Date   Diabetes mellitus without complication (HCC)    Hypertension     Past Surgical History:  Procedure Laterality Date   CESAREAN SECTION     x3   CHOLECYSTECTOMY     TUBAL LIGATION      Social History   Socioeconomic History   Marital status: Married    Spouse name: Not on file   Number of children: Not on file   Years of education: Not on file   Highest education level: Not on file  Occupational History   Not on file  Tobacco Use   Smoking status: Never   Smokeless tobacco: Never  Substance and Sexual Activity   Alcohol use: Yes    Comment: ocassionally   Drug use: No   Sexual activity: Not on file  Other Topics Concern   Not on file  Social History Narrative   Not on file   Social Drivers of Health   Financial Resource Strain: Not on file  Food Insecurity: Not on file  Transportation Needs: Not on file  Physical Activity: Not on file  Stress: Not on file  Social Connections: Not on file  Intimate Partner Violence: Not on file    Family History  Problem Relation Age of Onset   Hypertension Mother    Diabetes Father    Heart attack Father    Cancer Brother     No Known Allergies  Outpatient Medications Prior to Visit  Medication Sig   Glucose Blood (BLOOD GLUCOSE TEST STRIPS 333) STRP As directed   [DISCONTINUED] glipiZIDE  (GLUCOTROL  XL)  10 MG 24 hr tablet Take 1 tablet (10 mg total) by mouth daily with breakfast.   [DISCONTINUED] insulin  NPH-regular Human (NOVOLIN  70/30 RELION) (70-30) 100 UNIT/ML injection Inject 40 units SQ before breakfast and before evening meal.   Injecte 40 unidades subcutaneamente dos veces al dia con el alimento y antes de el alimento de la tarde   [DISCONTINUED] losartan  (COZAAR ) 50 MG tablet Take 1 tablet (50 mg total) by mouth daily. Tome una tableta por boca diaria   [DISCONTINUED] metFORMIN  (GLUCOPHAGE ) 1000 MG tablet Take 1 tablet (1,000 mg total) by mouth 2 (two) times daily with a meal.   [DISCONTINUED] rosuvastatin  (CRESTOR ) 20 MG tablet Take 1 tablet (20 mg total) by mouth daily.   No facility-administered medications prior to visit.    Review of Systems  Constitutional: Negative.   HENT: Negative.    Eyes: Negative.   Respiratory: Negative.  Negative for shortness of breath.   Cardiovascular: Negative.  Negative for chest pain.  Gastrointestinal: Negative.  Negative for abdominal pain, constipation and diarrhea.  Genitourinary: Negative.   Musculoskeletal:  Negative for joint pain and myalgias.  Skin: Negative.   Neurological: Negative.  Negative for dizziness and headaches.  Endo/Heme/Allergies: Negative.   All other systems reviewed and are  negative.      Objective:   BP (!) 138/58   Pulse 82   Ht 5' (1.524 m)   Wt 150 lb (68 kg)   LMP 03/04/2016 (Approximate)   SpO2 95%   BMI 29.29 kg/m   Vitals:   02/28/23 1037  BP: (!) 138/58  Pulse: 82  Height: 5' (1.524 m)  Weight: 150 lb (68 kg)  SpO2: 95%  BMI (Calculated): 29.3    Physical Exam Vitals and nursing note reviewed.  Constitutional:      Appearance: Normal appearance. She is normal weight.  HENT:     Head: Normocephalic and atraumatic.     Nose: Nose normal.     Mouth/Throat:     Mouth: Mucous membranes are moist.  Eyes:     Extraocular Movements: Extraocular movements intact.     Conjunctiva/sclera:  Conjunctivae normal.     Pupils: Pupils are equal, round, and reactive to light.  Cardiovascular:     Rate and Rhythm: Normal rate and regular rhythm.     Pulses: Normal pulses.     Heart sounds: Normal heart sounds.  Pulmonary:     Effort: Pulmonary effort is normal.     Breath sounds: Normal breath sounds.  Abdominal:     General: Abdomen is flat. Bowel sounds are normal.     Palpations: Abdomen is soft.  Musculoskeletal:        General: Normal range of motion.     Cervical back: Normal range of motion.  Skin:    General: Skin is warm and dry.  Neurological:     General: No focal deficit present.     Mental Status: She is alert and oriented to person, place, and time.  Psychiatric:        Mood and Affect: Mood normal.        Behavior: Behavior normal.        Thought Content: Thought content normal.        Judgment: Judgment normal.      Results for orders placed or performed in visit on 02/28/23  POCT CBG (Fasting - Glucose)  Result Value Ref Range   Glucose Fasting, POC 163 (A) 70 - 99 mg/dL    Recent Results (from the past 2160 hours)  Lipid panel     Status: None   Collection Time: 01/24/23  9:36 AM  Result Value Ref Range   Cholesterol, Total 135 100 - 199 mg/dL   Triglycerides 874 0 - 149 mg/dL   HDL 48 >60 mg/dL   VLDL Cholesterol Cal 22 5 - 40 mg/dL   LDL Chol Calc (NIH) 65 0 - 99 mg/dL   Chol/HDL Ratio 2.8 0.0 - 4.4 ratio    Comment:                                   T. Chol/HDL Ratio                                             Men  Women                               1/2 Avg.Risk  3.4    3.3  Avg.Risk  5.0    4.4                                2X Avg.Risk  9.6    7.1                                3X Avg.Risk 23.4   11.0   CMP14+EGFR     Status: Abnormal   Collection Time: 01/24/23  9:36 AM  Result Value Ref Range   Glucose 288 (H) 70 - 99 mg/dL   BUN 16 6 - 24 mg/dL   Creatinine, Ser 9.41 0.57 - 1.00 mg/dL   eGFR  892 >40 fO/fpw/8.26   BUN/Creatinine Ratio 28 (H) 9 - 23   Sodium 135 134 - 144 mmol/L   Potassium 4.2 3.5 - 5.2 mmol/L   Chloride 99 96 - 106 mmol/L   CO2 22 20 - 29 mmol/L   Calcium  9.6 8.7 - 10.2 mg/dL   Total Protein 6.8 6.0 - 8.5 g/dL   Albumin 3.9 3.8 - 4.9 g/dL   Globulin, Total 2.9 1.5 - 4.5 g/dL   Bilirubin Total 0.4 0.0 - 1.2 mg/dL   Alkaline Phosphatase 205 (H) 44 - 121 IU/L   AST 23 0 - 40 IU/L   ALT 25 0 - 32 IU/L  Hemoglobin A1c     Status: Abnormal   Collection Time: 01/24/23  9:36 AM  Result Value Ref Range   Hgb A1c MFr Bld 12.8 (H) 4.8 - 5.6 %    Comment:          Prediabetes: 5.7 - 6.4          Diabetes: >6.4          Glycemic control for adults with diabetes: <7.0    Est. average glucose Bld gHb Est-mCnc 321 mg/dL  POCT CBG (Fasting - Glucose)     Status: Abnormal   Collection Time: 01/28/23  1:38 PM  Result Value Ref Range   Glucose Fasting, POC 277 (A) 70 - 99 mg/dL  POCT CBG (Fasting - Glucose)     Status: Abnormal   Collection Time: 02/28/23 10:40 AM  Result Value Ref Range   Glucose Fasting, POC 163 (A) 70 - 99 mg/dL      Assessment & Plan:  Continue same medications.   Problem List Items Addressed This Visit       Endocrine   Type 2 diabetes mellitus with complication (HCC) - Primary   Relevant Medications   glipiZIDE  (GLUCOTROL  XL) 10 MG 24 hr tablet   insulin  NPH-regular Human (NOVOLIN  70/30 RELION) (70-30) 100 UNIT/ML injection   losartan  (COZAAR ) 50 MG tablet   metFORMIN  (GLUCOPHAGE ) 1000 MG tablet   rosuvastatin  (CRESTOR ) 20 MG tablet   Other Relevant Orders   POCT CBG (Fasting - Glucose) (Completed)    Return in about 10 weeks (around 05/09/2023) for with fasting labs prior.   Total time spent: 25 minutes  Google, NP  02/28/2023   This document may have been prepared by Dragon Voice Recognition software and as such may include unintentional dictation errors.

## 2023-05-13 ENCOUNTER — Other Ambulatory Visit: Payer: Self-pay

## 2023-05-13 DIAGNOSIS — E118 Type 2 diabetes mellitus with unspecified complications: Secondary | ICD-10-CM

## 2023-05-13 DIAGNOSIS — I10 Essential (primary) hypertension: Secondary | ICD-10-CM

## 2023-05-13 DIAGNOSIS — E782 Mixed hyperlipidemia: Secondary | ICD-10-CM

## 2023-05-14 LAB — LIPID PANEL
Chol/HDL Ratio: 2.4 ratio (ref 0.0–4.4)
Cholesterol, Total: 99 mg/dL — ABNORMAL LOW (ref 100–199)
HDL: 41 mg/dL (ref >39)
LDL Chol Calc (NIH): 35 mg/dL (ref 0–99)
Triglycerides: 133 mg/dL (ref 0–149)
VLDL Cholesterol Cal: 23 mg/dL (ref 5–40)

## 2023-05-14 LAB — CMP14+EGFR
ALT: 27 IU/L (ref 0–32)
AST: 24 IU/L (ref 0–40)
Albumin: 4.1 g/dL (ref 3.8–4.9)
Alkaline Phosphatase: 133 IU/L — ABNORMAL HIGH (ref 44–121)
BUN/Creatinine Ratio: 16 (ref 9–23)
BUN: 10 mg/dL (ref 6–24)
Bilirubin Total: 0.5 mg/dL (ref 0.0–1.2)
CO2: 24 mmol/L (ref 20–29)
Calcium: 9.5 mg/dL (ref 8.7–10.2)
Chloride: 100 mmol/L (ref 96–106)
Creatinine, Ser: 0.62 mg/dL (ref 0.57–1.00)
Globulin, Total: 3.2 g/dL (ref 1.5–4.5)
Glucose: 183 mg/dL — ABNORMAL HIGH (ref 70–99)
Potassium: 4.3 mmol/L (ref 3.5–5.2)
Sodium: 138 mmol/L (ref 134–144)
Total Protein: 7.3 g/dL (ref 6.0–8.5)
eGFR: 105 mL/min/{1.73_m2} (ref >59)

## 2023-05-14 LAB — HEMOGLOBIN A1C
Est. average glucose Bld gHb Est-mCnc: 309 mg/dL
Hgb A1c MFr Bld: 12.4 % — ABNORMAL HIGH (ref 4.8–5.6)

## 2023-05-14 LAB — TSH: TSH: 1.92 u[IU]/mL (ref 0.450–4.500)

## 2023-05-16 ENCOUNTER — Ambulatory Visit: Payer: Self-pay | Admitting: Cardiology

## 2023-05-16 ENCOUNTER — Encounter: Payer: Self-pay | Admitting: Cardiology

## 2023-05-16 ENCOUNTER — Other Ambulatory Visit: Payer: Self-pay

## 2023-05-16 VITALS — BP 124/58 | HR 85 | Ht 60.0 in | Wt 145.0 lb

## 2023-05-16 DIAGNOSIS — E118 Type 2 diabetes mellitus with unspecified complications: Secondary | ICD-10-CM

## 2023-05-16 DIAGNOSIS — I10 Essential (primary) hypertension: Secondary | ICD-10-CM

## 2023-05-16 DIAGNOSIS — R8290 Unspecified abnormal findings in urine: Secondary | ICD-10-CM

## 2023-05-16 DIAGNOSIS — E782 Mixed hyperlipidemia: Secondary | ICD-10-CM

## 2023-05-16 LAB — POCT URINALYSIS DIPSTICK
Bilirubin, UA: NEGATIVE
Blood, UA: POSITIVE
Glucose, UA: POSITIVE — AB
Ketones, UA: NEGATIVE
Nitrite, UA: NEGATIVE
Protein, UA: POSITIVE — AB
Spec Grav, UA: >=1.03 — AB (ref 1.010–1.025)
Urobilinogen, UA: 0.2 U/dL
pH, UA: 6 (ref 5.0–8.0)

## 2023-05-16 LAB — POC CREATINE & ALBUMIN,URINE
Albumin/Creatinine Ratio, Urine, POC: >300
Creatinine, POC: 200 mg/dL
Microalbumin Ur, POC: 150 mg/L

## 2023-05-16 MED ORDER — INSULIN LISPRO (1 UNIT DIAL) 100 UNIT/ML (KWIKPEN): PEN_INJECTOR | SUBCUTANEOUS | 11 refills | Status: AC

## 2023-05-16 NOTE — Progress Notes (Signed)
 Established Patient Office Visit  Subjective:  Patient ID: Alice Perez, female    DOB: Dec 13, 1965  Age: 58 y.o. MRN: [id number]  Chief Complaint  Patient presents with   Follow-up    10 weeks follow up    Patient in office for 3 month follow up, discuss recent lab results. Patient's son is present in the room and acts as the interpretor. Patient is self pay Patient has no complaints today. Discussed recent lab work. LDL at goal. Hgb A1c continues to be uncontrolled. Son states patient has not been taking insulin as prescribed, not taking the full 40 units twice daily. Patient unable to take Jardiance due to cost. Will add a short acting insulin on a sliding scale.  Urine today for micro album, also UA due to odor. Patient has no urinary complaints today.  Information given to patient and son for local free clinic for free health care.     No other concerns at this time.   Past Medical History:  Diagnosis Date   Diabetes mellitus without complication (HCC)    Hypertension     Past Surgical History:  Procedure Laterality Date   CESAREAN SECTION     x3   CHOLECYSTECTOMY     TUBAL LIGATION      Social History   Socioeconomic History   Marital status: Married    Spouse name: Not on file   Number of children: Not on file   Years of education: Not on file   Highest education level: Not on file  Occupational History   Not on file  Tobacco Use   Smoking status: Never   Smokeless tobacco: Never  Substance and Sexual Activity   Alcohol use: Yes    Comment: ocassionally   Drug use: No   Sexual activity: Not on file  Other Topics Concern   Not on file  Social History Narrative   Not on file   Social Drivers of Health   Financial Resource Strain: Not on file  Food Insecurity: Not on file  Transportation Needs: Not on file  Physical Activity: Not on file  Stress: Not on file  Social Connections: Not on file  Intimate Partner Violence: Not on file    Family  History  Problem Relation Age of Onset   Hypertension Mother    Diabetes Father    Heart attack Father    Cancer Brother     No Known Allergies  Outpatient Medications Prior to Visit  Medication Sig   glipiZIDE (GLUCOTROL XL) 10 MG 24 hr tablet Take 1 tablet (10 mg total) by mouth daily with breakfast.   Glucose Blood (BLOOD GLUCOSE TEST STRIPS 333) STRP As directed   insulin NPH-regular Human (NOVOLIN 70/30 RELION) (70-30) 100 UNIT/ML injection Inject 40 units SQ before breakfast and before evening meal.   Injecte 40 unidades subcutaneamente dos veces al dia con el alimento y antes de el alimento de la tarde   losartan (COZAAR) 50 MG tablet Take 1 tablet (50 mg total) by mouth daily. T[place] una tableta por boca diaria   metFORMIN (GLUCOPHAGE) 1000 MG tablet Take 1 tablet (1,000 mg total) by mouth 2 (two) times daily with a meal.   rosuvastatin (CRESTOR) 20 MG tablet Take 1 tablet (20 mg total) by mouth daily.   No facility-administered medications prior to visit.    Review of Systems  Constitutional: Negative.   HENT: Negative.    Eyes: Negative.   Respiratory: Negative.  Negative for shortness of  breath.   Cardiovascular: Negative.  Negative for chest pain.  Gastrointestinal: Negative.  Negative for abdominal pain, constipation and diarrhea.  Genitourinary: Negative.   Musculoskeletal:  Negative for joint pain and myalgias.  Skin: Negative.   Neurological: Negative.  Negative for dizziness and headaches.  Endo/Heme/Allergies: Negative.   All other systems reviewed and are negative.      Objective:   BP (!) 124/58   Pulse 85   Ht 5' (1.524 m)   Wt 145 lb (65.8 kg)   LMP 03/04/2016 (Approximate)   SpO2 95%   BMI 28.32 kg/m   Vitals:   05/16/23 0908  BP: (!) 124/58  Pulse: 85  Height: 5' (1.524 m)  Weight: 145 lb (65.8 kg)  SpO2: 95%  BMI (Calculated): 28.32    Physical Exam Vitals and nursing note reviewed.  Constitutional:      Appearance: Normal  appearance. She is normal weight.  HENT:     Head: Normocephalic and atraumatic.     Nose: Nose normal.     Mouth/Throat:     Mouth: Mucous membranes are moist.  Eyes:     Extraocular Movements: Extraocular movements intact.     Conjunctiva/sclera: Conjunctivae normal.     Pupils: Pupils are equal, round, and reactive to light.  Cardiovascular:     Rate and Rhythm: Normal rate and regular rhythm.     Pulses: Normal pulses.     Heart sounds: Normal heart sounds.  Pulmonary:     Effort: Pulmonary effort is normal.     Breath sounds: Normal breath sounds.  Abdominal:     General: Abdomen is flat. Bowel sounds are normal.     Palpations: Abdomen is soft.  Musculoskeletal:        General: Normal range of motion.     Cervical back: Normal range of motion.  Skin:    General: Skin is warm and dry.  Neurological:     General: No focal deficit present.     Mental Status: She is alert and oriented to person, place, and time.  Psychiatric:        Mood and Affect: Mood normal.        Behavior: Behavior normal.        Thought Content: Thought content normal.        Judgment: Judgment normal.      Results for orders placed or performed in visit on 05/16/23  POC CREATINE & ALBUMIN,URINE  Result Value Ref Range   Microalbumin Ur, POC 150 mg/L   Creatinine, POC 200 mg/dL   Albumin/Creatinine Ratio, Urine, POC >300   POCT Urinalysis Dipstick (81002)  Result Value Ref Range   Color, UA     Clarity, UA     Glucose, UA Positive (A) Negative   Bilirubin, UA Negative    Ketones, UA Negative    Spec Grav, UA >=1.030 (A) 1.010 - 1.025   Blood, UA Positive    pH, UA 6.0 5.0 - 8.0   Protein, UA Positive (A) Negative   Urobilinogen, UA 0.2 0.2 or 1.0 E.U./dL   Nitrite, UA Negative    Leukocytes, UA Trace (A) Negative   Appearance     Odor      Recent Results (from the past 2160 hours)  POCT CBG (Fasting - Glucose)     Status: Abnormal   Collection Time: 02/28/23 10:40 AM  Result  Value Ref Range   Glucose Fasting, POC 163 (A) 70 - 99 mg/dL  Hemoglobin E8B  Status: Abnormal   Collection Time: 05/13/23  8:22 AM  Result Value Ref Range   Hgb A1c MFr Bld 12.4 (H) 4.8 - 5.6 %    Comment:          Prediabetes: 5.7 - 6.4          Diabetes: >6.4          Glycemic control for adults with diabetes: <7.0    Est. average glucose Bld gHb Est-mCnc 309 mg/dL  TSH     Status: None   Collection Time: 05/13/23  8:22 AM  Result Value Ref Range   TSH 1.920 0.450 - 4.500 uIU/mL  CMP14+EGFR     Status: Abnormal   Collection Time: 05/13/23  8:22 AM  Result Value Ref Range   Glucose 183 (H) 70 - 99 mg/dL   BUN 10 6 - 24 mg/dL   Creatinine, Ser 8.11 0.57 - 1.00 mg/dL   eGFR 914 >78 GN/FAO/1.30   BUN/Creatinine Ratio 16 9 - 23   Sodium 138 134 - 144 mmol/L   Potassium 4.3 3.5 - 5.2 mmol/L   Chloride 100 96 - 106 mmol/L   CO2 24 20 - 29 mmol/L   Calcium 9.5 8.7 - 10.2 mg/dL   Total Protein 7.3 6.0 - 8.5 g/dL   Albumin 4.1 3.8 - 4.9 g/dL   Globulin, Total 3.2 1.5 - 4.5 g/dL   Bilirubin Total 0.5 0.0 - 1.2 mg/dL   Alkaline Phosphatase 133 (H) 44 - 121 IU/L   AST 24 0 - 40 IU/L   ALT 27 0 - 32 IU/L  Lipid panel     Status: Abnormal   Collection Time: 05/13/23  8:22 AM  Result Value Ref Range   Cholesterol, Total 99 (L) 100 - 199 mg/dL   Triglycerides 865 0 - 149 mg/dL   HDL 41 >78 mg/dL   VLDL Cholesterol Cal 23 5 - 40 mg/dL   LDL Chol Calc (NIH) 35 0 - 99 mg/dL   Chol/HDL Ratio 2.4 0.0 - 4.4 ratio    Comment:                                   T. Chol/HDL Ratio                                             Men  Women                               1/2 Avg.Risk  3.4    3.3                                   Avg.Risk  5.0    4.4                                2X Avg.Risk  9.6    7.1                                3X Avg.Risk 23.4   11.0   POC CREATINE & ALBUMIN,URINE  Status: Abnormal   Collection Time: 05/16/23 10:12 AM  Result Value Ref Range   Microalbumin Ur, POC  150 mg/L   Creatinine, POC 200 mg/dL   Albumin/Creatinine Ratio, Urine, POC >300     Comment: High Abnormal  POCT Urinalysis Dipstick (21308)     Status: Abnormal   Collection Time: 05/16/23 11:52 AM  Result Value Ref Range   Color, UA     Clarity, UA     Glucose, UA Positive (A) Negative   Bilirubin, UA Negative    Ketones, UA Negative    Spec Grav, UA >=1.030 (A) 1.010 - 1.025   Blood, UA Positive    pH, UA 6.0 5.0 - 8.0   Protein, UA Positive (A) Negative   Urobilinogen, UA 0.2 0.2 or 1.0 E.U./dL   Nitrite, UA Negative    Leukocytes, UA Trace (A) Negative   Appearance     Odor        Assessment & Plan:  Take insulin as prescribed. Add sliding scale lispro.  Call [place] [facility] for additional help with medications.  Micro albumin and UA today  Problem List Items Addressed This Visit       Cardiovascular and Mediastinum   Essential hypertension, benign     Endocrine   Type 2 diabetes mellitus with complication (HCC) - Primary   Relevant Medications   insulin lispro (HUMALOG KWIKPEN) 100 UNIT/ML KwikPen   Other Relevant Orders   POC CREATINE & ALBUMIN,URINE (Completed)     Other   Hyperlipidemia   Other Visit Diagnoses       Abnormal urine odor       Relevant Orders   POCT Urinalysis Dipstick (65784) (Completed)       Return in about 3 months (around 08/16/2023) for with fasting labs prior.   Total time spent: 25 minutes  [employer], NP  05/16/2023   This document may have been prepared by Dragon Voice Recognition software and as such may include unintentional dictation errors.

## 2023-05-20 LAB — URINE CULTURE

## 2023-05-21 ENCOUNTER — Other Ambulatory Visit: Payer: Self-pay | Admitting: Cardiology

## 2023-05-21 MED ORDER — NITROFURANTOIN MONOHYD MACRO 100 MG PO CAPS: 100.0000 mg | ORAL_CAPSULE | Freq: Two times a day (BID) | ORAL | 0 refills | Status: AC

## 2023-08-18 ENCOUNTER — Ambulatory Visit: Payer: Self-pay | Admitting: Cardiology

## 2023-08-29 ENCOUNTER — Ambulatory Visit: Payer: Self-pay | Admitting: Cardiology
